# Patient Record
Sex: Female | Born: 1995 | Race: White | Hispanic: No | Marital: Single | Smoking: Never smoker
Health system: Southern US, Community
[De-identification: ages and names within clinical notes are randomized; demographics above are authoritative.]

## PROBLEM LIST (undated history)

## (undated) DIAGNOSIS — G43909 Migraine, unspecified, not intractable, without status migrainosus: Secondary | ICD-10-CM

## (undated) HISTORY — PX: NO PAST SURGERIES: SHX2092

---
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
## 2017-01-12 DIAGNOSIS — R102 Pelvic and perineal pain: Secondary | ICD-10-CM | POA: Diagnosis not present

## 2017-01-12 DIAGNOSIS — M545 Low back pain: Secondary | ICD-10-CM | POA: Diagnosis not present

## 2017-01-12 DIAGNOSIS — F419 Anxiety disorder, unspecified: Secondary | ICD-10-CM | POA: Diagnosis not present

## 2017-01-15 DIAGNOSIS — Z01419 Encounter for gynecological examination (general) (routine) without abnormal findings: Secondary | ICD-10-CM | POA: Diagnosis not present

## 2017-01-15 DIAGNOSIS — Z6824 Body mass index (BMI) 24.0-24.9, adult: Secondary | ICD-10-CM | POA: Diagnosis not present

## 2017-01-15 DIAGNOSIS — Z3042 Encounter for surveillance of injectable contraceptive: Secondary | ICD-10-CM | POA: Diagnosis not present

## 2017-04-08 DIAGNOSIS — Z3042 Encounter for surveillance of injectable contraceptive: Secondary | ICD-10-CM | POA: Diagnosis not present

## 2017-05-13 DIAGNOSIS — F419 Anxiety disorder, unspecified: Secondary | ICD-10-CM | POA: Diagnosis not present

## 2017-06-24 DIAGNOSIS — Z3042 Encounter for surveillance of injectable contraceptive: Secondary | ICD-10-CM | POA: Diagnosis not present

## 2017-09-10 DIAGNOSIS — R399 Unspecified symptoms and signs involving the genitourinary system: Secondary | ICD-10-CM | POA: Diagnosis not present

## 2017-09-10 DIAGNOSIS — L709 Acne, unspecified: Secondary | ICD-10-CM | POA: Diagnosis not present

## 2017-09-10 DIAGNOSIS — Z3009 Encounter for other general counseling and advice on contraception: Secondary | ICD-10-CM | POA: Diagnosis not present

## 2017-10-15 DIAGNOSIS — J069 Acute upper respiratory infection, unspecified: Secondary | ICD-10-CM | POA: Diagnosis not present

## 2017-11-03 DIAGNOSIS — L700 Acne vulgaris: Secondary | ICD-10-CM | POA: Diagnosis not present

## 2018-01-25 DIAGNOSIS — L700 Acne vulgaris: Secondary | ICD-10-CM | POA: Diagnosis not present

## 2018-03-29 ENCOUNTER — Other Ambulatory Visit: Payer: Self-pay

## 2018-03-29 ENCOUNTER — Emergency Department (HOSPITAL_BASED_OUTPATIENT_CLINIC_OR_DEPARTMENT_OTHER)
Admission: EM | Admit: 2018-03-29 | Discharge: 2018-03-30 | Disposition: A | Discharge status: Home / Self Care | Payer: BLUE CROSS/BLUE SHIELD | Attending: Emergency Medicine | Admitting: Emergency Medicine

## 2018-03-29 ENCOUNTER — Encounter (HOSPITAL_BASED_OUTPATIENT_CLINIC_OR_DEPARTMENT_OTHER): Payer: Self-pay | Admitting: *Deleted

## 2018-03-29 DIAGNOSIS — G43809 Other migraine, not intractable, without status migrainosus: Secondary | ICD-10-CM | POA: Diagnosis not present

## 2018-03-29 DIAGNOSIS — R112 Nausea with vomiting, unspecified: Secondary | ICD-10-CM | POA: Diagnosis not present

## 2018-03-29 DIAGNOSIS — R509 Fever, unspecified: Secondary | ICD-10-CM | POA: Insufficient documentation

## 2018-03-29 DIAGNOSIS — R1084 Generalized abdominal pain: Secondary | ICD-10-CM | POA: Diagnosis not present

## 2018-03-29 DIAGNOSIS — R109 Unspecified abdominal pain: Secondary | ICD-10-CM | POA: Diagnosis not present

## 2018-03-29 DIAGNOSIS — R5383 Other fatigue: Secondary | ICD-10-CM | POA: Diagnosis not present

## 2018-03-29 DIAGNOSIS — Z79899 Other long term (current) drug therapy: Secondary | ICD-10-CM | POA: Diagnosis not present

## 2018-03-29 LAB — CBC WITH DIFFERENTIAL/PLATELET
Abs Immature Granulocytes: 0.03 10*3/uL (ref 0.00–0.07)
BASOS ABS: 0.1 10*3/uL (ref 0.0–0.1)
Basophils Relative: 1 %
EOS PCT: 1 %
Eosinophils Absolute: 0.1 10*3/uL (ref 0.0–0.5)
HCT: 41 % (ref 36.0–46.0)
Hemoglobin: 13 g/dL (ref 12.0–15.0)
Immature Granulocytes: 0 %
LYMPHS PCT: 23 %
Lymphs Abs: 1.9 10*3/uL (ref 0.7–4.0)
MCH: 29.8 pg (ref 26.0–34.0)
MCHC: 31.7 g/dL (ref 30.0–36.0)
MCV: 94 fL (ref 80.0–100.0)
Monocytes Absolute: 0.5 10*3/uL (ref 0.1–1.0)
Monocytes Relative: 6 %
NRBC: 0 % (ref 0.0–0.2)
Neutro Abs: 5.8 10*3/uL (ref 1.7–7.7)
Neutrophils Relative %: 69 %
Platelets: 232 10*3/uL (ref 150–400)
RBC: 4.36 MIL/uL (ref 3.87–5.11)
RDW: 12.6 % (ref 11.5–15.5)
WBC: 8.4 10*3/uL (ref 4.0–10.5)

## 2018-03-29 LAB — COMPREHENSIVE METABOLIC PANEL
ALBUMIN: 4.4 g/dL (ref 3.5–5.0)
ALK PHOS: 66 U/L (ref 38–126)
ALT: 17 U/L (ref 0–44)
ANION GAP: 9 (ref 5–15)
AST: 16 U/L (ref 15–41)
BILIRUBIN TOTAL: 0.7 mg/dL (ref 0.3–1.2)
BUN: 11 mg/dL (ref 6–20)
CALCIUM: 9.8 mg/dL (ref 8.9–10.3)
CO2: 24 mmol/L (ref 22–32)
Chloride: 101 mmol/L (ref 98–111)
Creatinine, Ser: 0.64 mg/dL (ref 0.44–1.00)
GFR calc Af Amer: >60 mL/min (ref >60)
GFR calc non Af Amer: >60 mL/min (ref >60)
GLUCOSE: 90 mg/dL (ref 70–99)
POTASSIUM: 3.6 mmol/L (ref 3.5–5.1)
Sodium: 134 mmol/L — ABNORMAL LOW (ref 135–145)
Total Protein: 8 g/dL (ref 6.5–8.1)

## 2018-03-29 LAB — URINALYSIS, ROUTINE W REFLEX MICROSCOPIC
BILIRUBIN URINE: NEGATIVE
Glucose, UA: NEGATIVE mg/dL
Hgb urine dipstick: NEGATIVE
KETONES UR: 15 mg/dL — AB
Leukocytes,Ua: NEGATIVE
NITRITE: NEGATIVE
PH: 6.5 (ref 5.0–8.0)
Protein, ur: NEGATIVE mg/dL
Specific Gravity, Urine: 1.015 (ref 1.005–1.030)

## 2018-03-29 LAB — PREGNANCY, URINE: Preg Test, Ur: NEGATIVE

## 2018-03-29 MED ORDER — PROCHLORPERAZINE MALEATE 10 MG PO TABS
10.0000 mg | ORAL_TABLET | Freq: Once | ORAL | Status: DC
  Filled 2018-03-29: qty 1

## 2018-03-29 MED ORDER — ONDANSETRON HCL 4 MG PO TABS: 4.0000 mg | ORAL_TABLET | Freq: Four times a day (QID) | ORAL | 0 refills | Status: DC

## 2018-03-29 MED ORDER — KETOROLAC TROMETHAMINE 15 MG/ML IJ SOLN
15.0000 mg | Freq: Once | INTRAMUSCULAR | Status: AC
  Administered 2018-03-29: 15 mg via INTRAVENOUS
  Filled 2018-03-29: qty 1

## 2018-03-29 MED ORDER — DIPHENHYDRAMINE HCL 50 MG/ML IJ SOLN
25.0000 mg | Freq: Once | INTRAMUSCULAR | Status: AC
  Administered 2018-03-29: 25 mg via INTRAVENOUS
  Filled 2018-03-29: qty 1

## 2018-03-29 MED ORDER — NAPROXEN 500 MG PO TABS: 500.0000 mg | ORAL_TABLET | Freq: Two times a day (BID) | ORAL | 0 refills | Status: AC

## 2018-03-29 MED ORDER — SODIUM CHLORIDE 0.9 % IV BOLUS (SEPSIS)
1000.0000 mL | Freq: Once | INTRAVENOUS | Status: AC
  Administered 2018-03-29: 1000 mL via INTRAVENOUS

## 2018-03-29 MED ORDER — ONDANSETRON HCL 4 MG/2ML IJ SOLN
4.0000 mg | Freq: Once | INTRAMUSCULAR | Status: AC
  Administered 2018-03-29: 4 mg via INTRAVENOUS
  Filled 2018-03-29: qty 2

## 2018-03-29 MED ORDER — PROCHLORPERAZINE EDISYLATE 10 MG/2ML IJ SOLN
10.0000 mg | INTRAMUSCULAR | Status: AC
  Administered 2018-03-29: 10 mg via INTRAVENOUS
  Filled 2018-03-29: qty 2

## 2018-03-29 NOTE — ED Provider Notes (Signed)
[facility] EMERGENCY DEPARTMENT Provider Note   CSN: [id number] Arrival date & time: 03/29/18  1922    History   Chief Complaint Chief Complaint  Patient presents with  . Migraine    HPI Autumn Vaughn is a 23 y.o. female.     Patient is a 23 year old female with past medical history of migraine headaches who presents the emergency department for 1 day of migraine headache, nausea, vomiting and subjective fever.  She reports that her migraine headache is typical of her usual headaches and is frontal and radiating to the back of her head.  Reports that it feels slightly more intense than usual.  Reports that she has previously had vomiting associated with her migraines but has not had this much vomiting before.  Reports that she is unable to keep anything down.  She reports that she has some cramping abdominal pain which comes with eating and before vomiting. Has not tried anything for relief.  Denies any diarrhea, dysuria, vaginal bleeding, vaginal discharge.  Reports that she has had a normal periods.  Denies any coughing, sore throat, congestion.  Reports that she felt like she had a fever at home but did not take her temperature.  She reports that she has associated photophobia and phonophobia.  There are no exacerbating or relieving factors     Past Medical History:  Diagnosis Date  . Migraine     There are no active problems to display for this patient.   History reviewed. No pertinent surgical history.   OB History   No obstetric history on file.      Home Medications    Prior to Admission medications   Medication Sig Start Date End Date Taking? Authorizing Provider  doxycycline (ORACEA) 40 MG capsule Take 40 mg by mouth every morning.    [provider]  Famotidine (PEPCID PO) Take by mouth.    [provider]  FLUoxetine HCl (PROZAC PO) Take by mouth.    [provider]  naproxen (NAPROSYN) 500 MG tablet Take 1 tablet (500  mg total) by mouth 2 (two) times daily for 7 days. 03/29/18 04/05/18  Ronnie DossMcLean, Meline Russaw A, PA-C  ondansetron (ZOFRAN) 4 MG tablet Take 1 tablet (4 mg total) by mouth every 6 (six) hours. 03/29/18   Jeral PinchMcLean, Cyera Balboni A, PA-C    Family History History reviewed. No pertinent family history.  Social History Social History   Tobacco Use  . Smoking status: Never Smoker  . Smokeless tobacco: Never Used  Substance Use Topics  . Alcohol use: No  . Drug use: No     Allergies   Patient has no known allergies.   Review of Systems Review of Systems  Constitutional: Positive for appetite change and fatigue. Negative for activity change, chills and fever.  HENT: Negative for congestion, ear pain, rhinorrhea, sinus pressure, sinus pain, sore throat and trouble swallowing.   Eyes: Positive for photophobia. Negative for pain, redness and visual disturbance.  Respiratory: Negative for cough and shortness of breath.   Cardiovascular: Negative for chest pain and palpitations.  Gastrointestinal: Positive for abdominal pain, nausea and vomiting. Negative for constipation, diarrhea and rectal pain.  Genitourinary: Negative for dysuria, flank pain and hematuria.  Musculoskeletal: Negative for arthralgias and back pain.  Skin: Negative for color change and rash.  Allergic/Immunologic: Negative for immunocompromised state.  Neurological: Positive for headaches. Negative for dizziness, tremors, seizures, syncope, speech difficulty, light-headedness and numbness.  All other systems reviewed and are negative.  Physical Exam Updated Vital Signs BP 110/64 (BP Location: Right Arm)   Pulse 62   Temp 98.7 F (37.1 C) (Oral)   Resp 16   Ht 5\' 5"  (1.651 m)   Wt 63.5 kg   LMP 03/24/2018   SpO2 100%   BMI 23.30 kg/m   Physical Exam Vitals signs and nursing note reviewed.  Constitutional:      General: She is not in acute distress.    Appearance: Normal appearance.  HENT:     Head: Normocephalic and  atraumatic.     Nose: Nose normal.     Mouth/Throat:     Mouth: Mucous membranes are moist.     Pharynx: Oropharynx is clear.  Eyes:     Conjunctiva/sclera: Conjunctivae normal.     Pupils: Pupils are equal, round, and reactive to light.  Cardiovascular:     Rate and Rhythm: Normal rate and regular rhythm.  Pulmonary:     Effort: Pulmonary effort is normal.     Breath sounds: Normal breath sounds. No wheezing, rhonchi or rales.  Abdominal:     General: Abdomen is flat. Bowel sounds are normal.     Tenderness: There is abdominal tenderness (diffuse). There is no right CVA tenderness, left CVA tenderness or guarding.  Musculoskeletal:     Right lower leg: No edema.     Left lower leg: No edema.  Skin:    General: Skin is warm.     Capillary Refill: Capillary refill takes less than 2 seconds.  Neurological:     General: No focal deficit present.     Mental Status: She is alert and oriented to person, place, and time. Mental status is at baseline.     Sensory: No sensory deficit.     Motor: No weakness.     Coordination: Coordination normal.  Psychiatric:        Mood and Affect: Mood normal.      ED Treatments / Results  Labs (all labs ordered are listed, but only abnormal results are displayed) Labs Reviewed  COMPREHENSIVE METABOLIC PANEL - Abnormal; Notable for the following components:      Result Value   Sodium 134 (*)    All other components within normal limits  URINALYSIS, ROUTINE W REFLEX MICROSCOPIC - Abnormal; Notable for the following components:   Ketones, ur 15 (*)    All other components within normal limits  CBC WITH DIFFERENTIAL/PLATELET  PREGNANCY, URINE  INFLUENZA PANEL BY PCR (TYPE A & B)    EKG None  Radiology No results found.  Procedures Procedures (including critical care time)  Medications Ordered in ED Medications  ketorolac (TORADOL) 15 MG/ML injection 15 mg (15 mg Intravenous Given 03/29/18 2240)  sodium chloride 0.9 % bolus 1,000 mL  (0 mLs Intravenous Stopped 03/29/18 2351)  ondansetron (ZOFRAN) injection 4 mg (4 mg Intravenous Given 03/29/18 2240)  diphenhydrAMINE (BENADRYL) injection 25 mg (25 mg Intravenous Given 03/29/18 2241)  prochlorperazine (COMPAZINE) injection 10 mg (10 mg Intravenous Given 03/29/18 2310)     Initial Impression / Assessment and Plan / ED Course  I have reviewed the triage vital signs and the nursing notes.  Pertinent labs & imaging results that were available during my care of the patient were reviewed by me and considered in my medical decision making (see chart for details).  Clinical Course as of Mar 29 2349  Tue Mar 29, 2018  2341 Patient is feeling better and is sleeping on the stretcher.  Her lab work  is unremarkable.  Her symptoms are consistent with her usual migraine as well as an additional vomiting and fever.  I feel like she probably has a viral infection which is flaring her migraine.  I will send her prescriptions for Zofran and naproxen to the pharmacy.  I will refer her to a new neurologist as she states her migraines have become more frequent this month.  She was advised to stay hydrated and return to the emergency department if she has any new or worsening symptoms.  Patient plan was also was discussed with the patient's mother at bedside   [KM]    Clinical Course User Index [KM] Arlyn Dunning, PA-C       Based on review of vitals, medical screening exam, lab work and/or imaging, there does not appear to be an acute, emergent etiology for the patient's symptoms. Counseled pt on good return precautions and encouraged both PCP and ED follow-up as needed.  Prior to discharge, I also discussed incidental imaging findings with patient in detail and advised appropriate, recommended follow-up in detail.  Clinical Impression: 1. Other migraine without status migrainosus, not intractable   2. Non-intractable vomiting with nausea, unspecified vomiting type     Disposition:  Discharge    This note was prepared with assistance of Dragon voice recognition software. Occasional wrong-word or sound-a-like substitutions may have occurred due to the inherent limitations of voice recognition software.   Final Clinical Impressions(s) / ED Diagnoses   Final diagnoses:  Other migraine without status migrainosus, not intractable  Non-intractable vomiting with nausea, unspecified vomiting type    ED Discharge Orders         Ordered    ondansetron (ZOFRAN) 4 MG tablet  Every 6 hours     03/29/18 2345    naproxen (NAPROSYN) 500 MG tablet  2 times daily     03/29/18 2345           Jeral Pinch 03/29/18 2352    Charlynne Pander, MD 04/01/18 1416

## 2018-03-29 NOTE — Discharge Instructions (Signed)
Thank you for allowing me to care for you today. Please return to the emergency department if you have ANY new or worsening symptoms.  Drink plenty of water to stay hydrated.

## 2018-03-29 NOTE — ED Notes (Signed)
PT states understanding of care given, follow up care, and medication prescribed. PT ambulated from ED to car with a steady gait. 

## 2018-03-29 NOTE — ED Triage Notes (Signed)
Pt c/o " migraine" x 1 day  

## 2018-03-30 LAB — INFLUENZA PANEL BY PCR (TYPE A & B)
Influenza A By PCR: NEGATIVE
Influenza B By PCR: NEGATIVE

## 2018-04-18 DIAGNOSIS — G43119 Migraine with aura, intractable, without status migrainosus: Secondary | ICD-10-CM | POA: Diagnosis not present

## 2018-04-18 DIAGNOSIS — G44209 Tension-type headache, unspecified, not intractable: Secondary | ICD-10-CM | POA: Diagnosis not present

## 2018-04-18 DIAGNOSIS — F331 Major depressive disorder, recurrent, moderate: Secondary | ICD-10-CM | POA: Diagnosis not present

## 2018-05-19 DIAGNOSIS — G43519 Persistent migraine aura without cerebral infarction, intractable, without status migrainosus: Secondary | ICD-10-CM | POA: Diagnosis not present

## 2018-05-19 DIAGNOSIS — F4541 Pain disorder exclusively related to psychological factors: Secondary | ICD-10-CM | POA: Diagnosis not present

## 2018-06-08 DIAGNOSIS — G43519 Persistent migraine aura without cerebral infarction, intractable, without status migrainosus: Secondary | ICD-10-CM | POA: Diagnosis not present

## 2018-06-08 DIAGNOSIS — Z23 Encounter for immunization: Secondary | ICD-10-CM | POA: Diagnosis not present

## 2018-06-08 DIAGNOSIS — F331 Major depressive disorder, recurrent, moderate: Secondary | ICD-10-CM | POA: Diagnosis not present

## 2018-07-11 DIAGNOSIS — Z23 Encounter for immunization: Secondary | ICD-10-CM | POA: Diagnosis not present

## 2018-07-11 DIAGNOSIS — F419 Anxiety disorder, unspecified: Secondary | ICD-10-CM | POA: Diagnosis not present

## 2018-07-11 DIAGNOSIS — G43519 Persistent migraine aura without cerebral infarction, intractable, without status migrainosus: Secondary | ICD-10-CM | POA: Diagnosis not present

## 2018-07-11 DIAGNOSIS — F331 Major depressive disorder, recurrent, moderate: Secondary | ICD-10-CM | POA: Diagnosis not present

## 2018-08-04 DIAGNOSIS — G43009 Migraine without aura, not intractable, without status migrainosus: Secondary | ICD-10-CM | POA: Diagnosis not present

## 2018-10-06 DIAGNOSIS — Z20828 Contact with and (suspected) exposure to other viral communicable diseases: Secondary | ICD-10-CM | POA: Diagnosis not present

## 2018-10-18 DIAGNOSIS — G43009 Migraine without aura, not intractable, without status migrainosus: Secondary | ICD-10-CM | POA: Diagnosis not present

## 2018-12-02 DIAGNOSIS — Z01419 Encounter for gynecological examination (general) (routine) without abnormal findings: Secondary | ICD-10-CM | POA: Diagnosis not present

## 2018-12-02 DIAGNOSIS — Z6826 Body mass index (BMI) 26.0-26.9, adult: Secondary | ICD-10-CM | POA: Diagnosis not present

## 2018-12-08 DIAGNOSIS — G43009 Migraine without aura, not intractable, without status migrainosus: Secondary | ICD-10-CM | POA: Diagnosis not present

## 2019-01-12 DIAGNOSIS — F331 Major depressive disorder, recurrent, moderate: Secondary | ICD-10-CM | POA: Diagnosis not present

## 2019-01-12 DIAGNOSIS — F419 Anxiety disorder, unspecified: Secondary | ICD-10-CM | POA: Diagnosis not present

## 2019-01-12 DIAGNOSIS — G43519 Persistent migraine aura without cerebral infarction, intractable, without status migrainosus: Secondary | ICD-10-CM | POA: Diagnosis not present

## 2019-01-12 DIAGNOSIS — Z23 Encounter for immunization: Secondary | ICD-10-CM | POA: Diagnosis not present

## 2019-01-12 DIAGNOSIS — R109 Unspecified abdominal pain: Secondary | ICD-10-CM | POA: Diagnosis not present

## 2019-01-19 DIAGNOSIS — G43009 Migraine without aura, not intractable, without status migrainosus: Secondary | ICD-10-CM | POA: Diagnosis not present

## 2019-02-23 DIAGNOSIS — F331 Major depressive disorder, recurrent, moderate: Secondary | ICD-10-CM | POA: Diagnosis not present

## 2019-02-23 DIAGNOSIS — F419 Anxiety disorder, unspecified: Secondary | ICD-10-CM | POA: Diagnosis not present

## 2019-02-28 DIAGNOSIS — Z23 Encounter for immunization: Secondary | ICD-10-CM | POA: Diagnosis not present

## 2019-02-28 DIAGNOSIS — L700 Acne vulgaris: Secondary | ICD-10-CM | POA: Diagnosis not present

## 2019-02-28 DIAGNOSIS — Z79899 Other long term (current) drug therapy: Secondary | ICD-10-CM | POA: Diagnosis not present

## 2019-03-20 DIAGNOSIS — L700 Acne vulgaris: Secondary | ICD-10-CM | POA: Diagnosis not present

## 2019-03-20 DIAGNOSIS — F331 Major depressive disorder, recurrent, moderate: Secondary | ICD-10-CM | POA: Diagnosis not present

## 2019-04-03 DIAGNOSIS — L700 Acne vulgaris: Secondary | ICD-10-CM | POA: Diagnosis not present

## 2019-04-03 DIAGNOSIS — Z79899 Other long term (current) drug therapy: Secondary | ICD-10-CM | POA: Diagnosis not present

## 2019-04-24 DIAGNOSIS — L700 Acne vulgaris: Secondary | ICD-10-CM | POA: Diagnosis not present

## 2019-05-11 DIAGNOSIS — G43009 Migraine without aura, not intractable, without status migrainosus: Secondary | ICD-10-CM | POA: Diagnosis not present

## 2019-05-29 DIAGNOSIS — Z79899 Other long term (current) drug therapy: Secondary | ICD-10-CM | POA: Diagnosis not present

## 2019-05-29 DIAGNOSIS — L700 Acne vulgaris: Secondary | ICD-10-CM | POA: Diagnosis not present

## 2019-06-01 ENCOUNTER — Ambulatory Visit: Payer: BC Managed Care – PPO | Attending: Internal Medicine

## 2019-06-01 DIAGNOSIS — Z23 Encounter for immunization: Secondary | ICD-10-CM

## 2019-06-01 NOTE — Progress Notes (Signed)
   Covid-19 Vaccination Clinic  Name:  Milca Sytsma    MRN: [id number] DOB: 1995-05-19  06/01/2019  Ms. Bryand was observed post Covid-19 immunization for 15 minutes without incident. She was provided with Vaccine Information Sheet and instruction to access the V-Safe system.   Ms. Strong was instructed to call 911 with any severe reactions post vaccine: Marland Kitchen Difficulty breathing  . Swelling of face and throat  . A fast heartbeat  . A bad rash all over body  . Dizziness and weakness   Immunizations Administered    Name Date Dose VIS Date Route   Pfizer COVID-19 Vaccine 06/01/2019  3:28 PM 0.3 mL 04/05/2018 Intramuscular   Manufacturer: [employer], [employer]   Lot: [id number]   NDC: [address]-1

## 2019-06-08 DIAGNOSIS — F331 Major depressive disorder, recurrent, moderate: Secondary | ICD-10-CM | POA: Diagnosis not present

## 2019-06-26 ENCOUNTER — Ambulatory Visit: Payer: BC Managed Care – PPO | Attending: Internal Medicine

## 2019-06-26 DIAGNOSIS — Z23 Encounter for immunization: Secondary | ICD-10-CM

## 2019-06-26 NOTE — Progress Notes (Signed)
   Covid-19 Vaccination Clinic  Name:  Autumn Vaughn    MRN: [id number] DOB: 1995/10/07  06/26/2019  [name] was observed post Covid-19 immunization for 15 minutes without incident. She was provided with Vaccine Information Sheet and instruction to access the V-Safe system.   [name] was instructed to call 911 with any severe reactions post vaccine: Marland Kitchen Difficulty breathing  . Swelling of face and throat  . A fast heartbeat  . A bad rash all over body  . Dizziness and weakness   Immunizations Administered    Name Date Dose VIS Date Route   Pfizer COVID-19 Vaccine 06/26/2019  3:08 PM 0.3 mL 04/05/2018 Intramuscular   Manufacturer: [employer], [employer]   Lot: [id number]   NDC: [address]-8

## 2019-07-03 DIAGNOSIS — L700 Acne vulgaris: Secondary | ICD-10-CM | POA: Diagnosis not present

## 2019-07-03 DIAGNOSIS — Z79899 Other long term (current) drug therapy: Secondary | ICD-10-CM | POA: Diagnosis not present

## 2019-08-02 DIAGNOSIS — Z79899 Other long term (current) drug therapy: Secondary | ICD-10-CM | POA: Diagnosis not present

## 2019-08-02 DIAGNOSIS — L700 Acne vulgaris: Secondary | ICD-10-CM | POA: Diagnosis not present

## 2019-08-09 DIAGNOSIS — F331 Major depressive disorder, recurrent, moderate: Secondary | ICD-10-CM | POA: Diagnosis not present

## 2019-08-09 DIAGNOSIS — F419 Anxiety disorder, unspecified: Secondary | ICD-10-CM | POA: Diagnosis not present

## 2019-09-04 DIAGNOSIS — L700 Acne vulgaris: Secondary | ICD-10-CM | POA: Diagnosis not present

## 2019-09-04 DIAGNOSIS — Z79899 Other long term (current) drug therapy: Secondary | ICD-10-CM | POA: Diagnosis not present

## 2019-09-14 DIAGNOSIS — F331 Major depressive disorder, recurrent, moderate: Secondary | ICD-10-CM | POA: Diagnosis not present

## 2019-09-14 DIAGNOSIS — F419 Anxiety disorder, unspecified: Secondary | ICD-10-CM | POA: Diagnosis not present

## 2019-09-14 DIAGNOSIS — J309 Allergic rhinitis, unspecified: Secondary | ICD-10-CM | POA: Diagnosis not present

## 2019-10-04 DIAGNOSIS — L700 Acne vulgaris: Secondary | ICD-10-CM | POA: Diagnosis not present

## 2019-10-04 DIAGNOSIS — Z79899 Other long term (current) drug therapy: Secondary | ICD-10-CM | POA: Diagnosis not present

## 2019-11-01 DIAGNOSIS — G43009 Migraine without aura, not intractable, without status migrainosus: Secondary | ICD-10-CM | POA: Diagnosis not present

## 2019-11-06 DIAGNOSIS — L700 Acne vulgaris: Secondary | ICD-10-CM | POA: Diagnosis not present

## 2019-11-06 DIAGNOSIS — Z79899 Other long term (current) drug therapy: Secondary | ICD-10-CM | POA: Diagnosis not present

## 2019-11-16 DIAGNOSIS — F331 Major depressive disorder, recurrent, moderate: Secondary | ICD-10-CM | POA: Diagnosis not present

## 2019-12-07 DIAGNOSIS — Z79899 Other long term (current) drug therapy: Secondary | ICD-10-CM | POA: Diagnosis not present

## 2019-12-07 DIAGNOSIS — L650 Telogen effluvium: Secondary | ICD-10-CM | POA: Diagnosis not present

## 2019-12-07 DIAGNOSIS — L700 Acne vulgaris: Secondary | ICD-10-CM | POA: Diagnosis not present

## 2019-12-08 DIAGNOSIS — L700 Acne vulgaris: Secondary | ICD-10-CM | POA: Diagnosis not present

## 2019-12-20 DIAGNOSIS — F419 Anxiety disorder, unspecified: Secondary | ICD-10-CM | POA: Diagnosis not present

## 2019-12-20 DIAGNOSIS — F331 Major depressive disorder, recurrent, moderate: Secondary | ICD-10-CM | POA: Diagnosis not present

## 2019-12-27 DIAGNOSIS — G43009 Migraine without aura, not intractable, without status migrainosus: Secondary | ICD-10-CM | POA: Diagnosis not present

## 2020-01-03 DIAGNOSIS — L700 Acne vulgaris: Secondary | ICD-10-CM | POA: Diagnosis not present

## 2020-01-26 ENCOUNTER — Ambulatory Visit: Payer: BC Managed Care – PPO | Attending: Internal Medicine

## 2020-01-26 DIAGNOSIS — Z23 Encounter for immunization: Secondary | ICD-10-CM

## 2020-01-26 NOTE — Progress Notes (Signed)
   Covid-19 Vaccination Clinic  Name:  Autumn Vaughn    MRN: [id number] DOB: September 18, 1995  01/26/2020  [name] was observed post Covid-19 immunization for 15 minutes without incident. She was provided with Vaccine Information Sheet and instruction to access the V-Safe system.   [name] was instructed to call 911 with any severe reactions post vaccine: Marland Kitchen Difficulty breathing  . Swelling of face and throat  . A fast heartbeat  . A bad rash all over body  . Dizziness and weakness   Immunizations Administered    Name Date Dose VIS Date Route   Pfizer COVID-19 Vaccine 01/26/2020  3:25 PM 0.3 mL 11/29/2019 Intramuscular   Manufacturer: [employer], [employer]   Lot: [id number]   NDC: [address]-1

## 2020-02-05 DIAGNOSIS — Z79899 Other long term (current) drug therapy: Secondary | ICD-10-CM | POA: Diagnosis not present

## 2020-02-05 DIAGNOSIS — L700 Acne vulgaris: Secondary | ICD-10-CM | POA: Diagnosis not present

## 2020-02-12 DIAGNOSIS — Z79899 Other long term (current) drug therapy: Secondary | ICD-10-CM | POA: Diagnosis not present

## 2020-02-12 DIAGNOSIS — Z13220 Encounter for screening for lipoid disorders: Secondary | ICD-10-CM | POA: Diagnosis not present

## 2020-02-12 DIAGNOSIS — Z23 Encounter for immunization: Secondary | ICD-10-CM | POA: Diagnosis not present

## 2020-02-12 DIAGNOSIS — F419 Anxiety disorder, unspecified: Secondary | ICD-10-CM | POA: Diagnosis not present

## 2020-02-12 DIAGNOSIS — F331 Major depressive disorder, recurrent, moderate: Secondary | ICD-10-CM | POA: Diagnosis not present

## 2020-02-12 DIAGNOSIS — R635 Abnormal weight gain: Secondary | ICD-10-CM | POA: Diagnosis not present

## 2020-02-13 DIAGNOSIS — G43009 Migraine without aura, not intractable, without status migrainosus: Secondary | ICD-10-CM | POA: Diagnosis not present

## 2020-02-15 DIAGNOSIS — Z03818 Encounter for observation for suspected exposure to other biological agents ruled out: Secondary | ICD-10-CM | POA: Diagnosis not present

## 2020-03-07 DIAGNOSIS — L309 Dermatitis, unspecified: Secondary | ICD-10-CM | POA: Diagnosis not present

## 2020-03-07 DIAGNOSIS — L700 Acne vulgaris: Secondary | ICD-10-CM | POA: Diagnosis not present

## 2020-03-07 DIAGNOSIS — L730 Acne keloid: Secondary | ICD-10-CM | POA: Diagnosis not present

## 2020-03-07 DIAGNOSIS — Z79899 Other long term (current) drug therapy: Secondary | ICD-10-CM | POA: Diagnosis not present

## 2020-03-27 DIAGNOSIS — G43519 Persistent migraine aura without cerebral infarction, intractable, without status migrainosus: Secondary | ICD-10-CM | POA: Diagnosis not present

## 2020-03-27 DIAGNOSIS — F419 Anxiety disorder, unspecified: Secondary | ICD-10-CM | POA: Diagnosis not present

## 2020-03-27 DIAGNOSIS — F331 Major depressive disorder, recurrent, moderate: Secondary | ICD-10-CM | POA: Diagnosis not present

## 2020-04-22 DIAGNOSIS — L700 Acne vulgaris: Secondary | ICD-10-CM | POA: Diagnosis not present

## 2020-04-23 DIAGNOSIS — L730 Acne keloid: Secondary | ICD-10-CM | POA: Diagnosis not present

## 2020-04-23 DIAGNOSIS — L650 Telogen effluvium: Secondary | ICD-10-CM | POA: Diagnosis not present

## 2020-05-13 DIAGNOSIS — F331 Major depressive disorder, recurrent, moderate: Secondary | ICD-10-CM | POA: Diagnosis not present

## 2020-05-13 DIAGNOSIS — F419 Anxiety disorder, unspecified: Secondary | ICD-10-CM | POA: Diagnosis not present

## 2020-05-13 DIAGNOSIS — G43519 Persistent migraine aura without cerebral infarction, intractable, without status migrainosus: Secondary | ICD-10-CM | POA: Diagnosis not present

## 2020-07-04 DIAGNOSIS — F331 Major depressive disorder, recurrent, moderate: Secondary | ICD-10-CM | POA: Diagnosis not present

## 2020-09-26 DIAGNOSIS — L700 Acne vulgaris: Secondary | ICD-10-CM | POA: Diagnosis not present

## 2020-09-26 DIAGNOSIS — Z79899 Other long term (current) drug therapy: Secondary | ICD-10-CM | POA: Diagnosis not present

## 2020-10-07 DIAGNOSIS — L700 Acne vulgaris: Secondary | ICD-10-CM | POA: Diagnosis not present

## 2020-10-07 DIAGNOSIS — F410 Panic disorder [episodic paroxysmal anxiety] without agoraphobia: Secondary | ICD-10-CM | POA: Diagnosis not present

## 2020-10-07 DIAGNOSIS — F331 Major depressive disorder, recurrent, moderate: Secondary | ICD-10-CM | POA: Diagnosis not present

## 2020-10-08 DIAGNOSIS — F411 Generalized anxiety disorder: Secondary | ICD-10-CM | POA: Diagnosis not present

## 2020-10-08 DIAGNOSIS — F331 Major depressive disorder, recurrent, moderate: Secondary | ICD-10-CM | POA: Diagnosis not present

## 2020-10-15 DIAGNOSIS — F411 Generalized anxiety disorder: Secondary | ICD-10-CM | POA: Diagnosis not present

## 2020-10-15 DIAGNOSIS — F331 Major depressive disorder, recurrent, moderate: Secondary | ICD-10-CM | POA: Diagnosis not present

## 2020-10-23 DIAGNOSIS — F411 Generalized anxiety disorder: Secondary | ICD-10-CM | POA: Diagnosis not present

## 2020-10-23 DIAGNOSIS — F331 Major depressive disorder, recurrent, moderate: Secondary | ICD-10-CM | POA: Diagnosis not present

## 2020-10-24 DIAGNOSIS — F411 Generalized anxiety disorder: Secondary | ICD-10-CM | POA: Diagnosis not present

## 2020-10-24 DIAGNOSIS — F331 Major depressive disorder, recurrent, moderate: Secondary | ICD-10-CM | POA: Diagnosis not present

## 2020-10-28 DIAGNOSIS — Z79899 Other long term (current) drug therapy: Secondary | ICD-10-CM | POA: Diagnosis not present

## 2020-10-28 DIAGNOSIS — L700 Acne vulgaris: Secondary | ICD-10-CM | POA: Diagnosis not present

## 2020-10-31 DIAGNOSIS — F331 Major depressive disorder, recurrent, moderate: Secondary | ICD-10-CM | POA: Diagnosis not present

## 2020-10-31 DIAGNOSIS — F411 Generalized anxiety disorder: Secondary | ICD-10-CM | POA: Diagnosis not present

## 2020-11-06 DIAGNOSIS — F331 Major depressive disorder, recurrent, moderate: Secondary | ICD-10-CM | POA: Diagnosis not present

## 2020-11-06 DIAGNOSIS — F411 Generalized anxiety disorder: Secondary | ICD-10-CM | POA: Diagnosis not present

## 2020-11-06 DIAGNOSIS — G4720 Circadian rhythm sleep disorder, unspecified type: Secondary | ICD-10-CM | POA: Diagnosis not present

## 2020-11-07 DIAGNOSIS — F331 Major depressive disorder, recurrent, moderate: Secondary | ICD-10-CM | POA: Diagnosis not present

## 2020-11-07 DIAGNOSIS — F411 Generalized anxiety disorder: Secondary | ICD-10-CM | POA: Diagnosis not present

## 2020-11-14 DIAGNOSIS — F411 Generalized anxiety disorder: Secondary | ICD-10-CM | POA: Diagnosis not present

## 2020-11-14 DIAGNOSIS — F331 Major depressive disorder, recurrent, moderate: Secondary | ICD-10-CM | POA: Diagnosis not present

## 2020-11-20 DIAGNOSIS — F411 Generalized anxiety disorder: Secondary | ICD-10-CM | POA: Diagnosis not present

## 2020-11-20 DIAGNOSIS — F331 Major depressive disorder, recurrent, moderate: Secondary | ICD-10-CM | POA: Diagnosis not present

## 2020-11-20 DIAGNOSIS — G4720 Circadian rhythm sleep disorder, unspecified type: Secondary | ICD-10-CM | POA: Diagnosis not present

## 2020-11-21 DIAGNOSIS — F411 Generalized anxiety disorder: Secondary | ICD-10-CM | POA: Diagnosis not present

## 2020-11-21 DIAGNOSIS — F331 Major depressive disorder, recurrent, moderate: Secondary | ICD-10-CM | POA: Diagnosis not present

## 2020-11-27 DIAGNOSIS — Z79899 Other long term (current) drug therapy: Secondary | ICD-10-CM | POA: Diagnosis not present

## 2020-11-27 DIAGNOSIS — L700 Acne vulgaris: Secondary | ICD-10-CM | POA: Diagnosis not present

## 2020-11-28 DIAGNOSIS — F331 Major depressive disorder, recurrent, moderate: Secondary | ICD-10-CM | POA: Diagnosis not present

## 2020-11-28 DIAGNOSIS — F411 Generalized anxiety disorder: Secondary | ICD-10-CM | POA: Diagnosis not present

## 2020-12-02 ENCOUNTER — Encounter (HOSPITAL_COMMUNITY): Payer: Self-pay | Admitting: Emergency Medicine

## 2020-12-02 ENCOUNTER — Emergency Department (HOSPITAL_COMMUNITY): Payer: BC Managed Care – PPO

## 2020-12-02 ENCOUNTER — Emergency Department (HOSPITAL_COMMUNITY)
Admission: EM | Admit: 2020-12-02 | Discharge: 2020-12-02 | Disposition: A | Discharge status: Home / Self Care | Payer: BC Managed Care – PPO | Attending: Emergency Medicine | Admitting: Emergency Medicine

## 2020-12-02 ENCOUNTER — Other Ambulatory Visit: Payer: Self-pay

## 2020-12-02 DIAGNOSIS — M549 Dorsalgia, unspecified: Secondary | ICD-10-CM | POA: Insufficient documentation

## 2020-12-02 DIAGNOSIS — Q438 Other specified congenital malformations of intestine: Secondary | ICD-10-CM | POA: Diagnosis not present

## 2020-12-02 DIAGNOSIS — I878 Other specified disorders of veins: Secondary | ICD-10-CM | POA: Diagnosis not present

## 2020-12-02 DIAGNOSIS — R109 Unspecified abdominal pain: Secondary | ICD-10-CM | POA: Diagnosis not present

## 2020-12-02 DIAGNOSIS — R1010 Upper abdominal pain, unspecified: Secondary | ICD-10-CM | POA: Diagnosis not present

## 2020-12-02 DIAGNOSIS — R112 Nausea with vomiting, unspecified: Secondary | ICD-10-CM | POA: Insufficient documentation

## 2020-12-02 DIAGNOSIS — M545 Low back pain, unspecified: Secondary | ICD-10-CM | POA: Diagnosis not present

## 2020-12-02 LAB — CBC WITH DIFFERENTIAL/PLATELET
Abs Immature Granulocytes: 0.03 10*3/uL (ref 0.00–0.07)
Basophils Absolute: 0.1 10*3/uL (ref 0.0–0.1)
Basophils Relative: 1 %
Eosinophils Absolute: 0.1 10*3/uL (ref 0.0–0.5)
Eosinophils Relative: 0 %
HCT: 39.4 % (ref 36.0–46.0)
Hemoglobin: 13 g/dL (ref 12.0–15.0)
Immature Granulocytes: 0 %
Lymphocytes Relative: 13 %
Lymphs Abs: 1.6 10*3/uL (ref 0.7–4.0)
MCH: 29.7 pg (ref 26.0–34.0)
MCHC: 33 g/dL (ref 30.0–36.0)
MCV: 90.2 fL (ref 80.0–100.0)
Monocytes Absolute: 0.8 10*3/uL (ref 0.1–1.0)
Monocytes Relative: 7 %
Neutro Abs: 9.5 10*3/uL — ABNORMAL HIGH (ref 1.7–7.7)
Neutrophils Relative %: 79 %
Platelets: 275 10*3/uL (ref 150–400)
RBC: 4.37 MIL/uL (ref 3.87–5.11)
RDW: 12.6 % (ref 11.5–15.5)
WBC: 12 10*3/uL — ABNORMAL HIGH (ref 4.0–10.5)
nRBC: 0 % (ref 0.0–0.2)

## 2020-12-02 LAB — URINALYSIS, ROUTINE W REFLEX MICROSCOPIC
Bilirubin Urine: NEGATIVE
Glucose, UA: NEGATIVE mg/dL
Hgb urine dipstick: NEGATIVE
Ketones, ur: 5 mg/dL — AB
Leukocytes,Ua: NEGATIVE
Nitrite: NEGATIVE
Protein, ur: NEGATIVE mg/dL
Specific Gravity, Urine: 1.016 (ref 1.005–1.030)
pH: 8 (ref 5.0–8.0)

## 2020-12-02 LAB — COMPREHENSIVE METABOLIC PANEL
ALT: 15 U/L (ref 0–44)
AST: 19 U/L (ref 15–41)
Albumin: 4.2 g/dL (ref 3.5–5.0)
Alkaline Phosphatase: 67 U/L (ref 38–126)
Anion gap: 12 (ref 5–15)
BUN: 13 mg/dL (ref 6–20)
CO2: 19 mmol/L — ABNORMAL LOW (ref 22–32)
Calcium: 9.7 mg/dL (ref 8.9–10.3)
Chloride: 107 mmol/L (ref 98–111)
Creatinine, Ser: 0.78 mg/dL (ref 0.44–1.00)
GFR, Estimated: >60 mL/min (ref >60)
Glucose, Bld: 125 mg/dL — ABNORMAL HIGH (ref 70–99)
Potassium: 3.4 mmol/L — ABNORMAL LOW (ref 3.5–5.1)
Sodium: 138 mmol/L (ref 135–145)
Total Bilirubin: 0.3 mg/dL (ref 0.3–1.2)
Total Protein: 7.4 g/dL (ref 6.5–8.1)

## 2020-12-02 LAB — I-STAT BETA HCG BLOOD, ED (MC, WL, AP ONLY): I-stat hCG, quantitative: <5 m[IU]/mL (ref <5)

## 2020-12-02 LAB — LIPASE, BLOOD: Lipase: 38 U/L (ref 11–51)

## 2020-12-02 MED ORDER — LIDOCAINE-EPINEPHRINE (PF) 2 %-1:200000 IJ SOLN: 20.0000 mL | Freq: Once | INTRAMUSCULAR | Status: DC

## 2020-12-02 MED ORDER — LORAZEPAM 2 MG/ML IJ SOLN
1.0000 mg | Freq: Once | INTRAMUSCULAR | Status: AC
  Administered 2020-12-02: 1 mg via INTRAVENOUS
  Filled 2020-12-02: qty 1

## 2020-12-02 MED ORDER — PROCHLORPERAZINE EDISYLATE 10 MG/2ML IJ SOLN
10.0000 mg | Freq: Once | INTRAMUSCULAR | Status: AC
  Administered 2020-12-02: 10 mg via INTRAVENOUS
  Filled 2020-12-02: qty 2

## 2020-12-02 MED ORDER — SODIUM CHLORIDE 0.9 % IV BOLUS
3000.0000 mL | Freq: Once | INTRAVENOUS | Status: AC
  Administered 2020-12-02: 1000 mL via INTRAVENOUS

## 2020-12-02 MED ORDER — PROCHLORPERAZINE MALEATE 10 MG PO TABS: 10.0000 mg | ORAL_TABLET | Freq: Three times a day (TID) | ORAL | 0 refills | Status: DC | PRN

## 2020-12-02 MED ORDER — ONDANSETRON 4 MG PO TBDP
4.0000 mg | ORAL_TABLET | Freq: Once | ORAL | Status: AC
  Administered 2020-12-02: 4 mg via ORAL
  Filled 2020-12-02: qty 1

## 2020-12-02 NOTE — ED Provider Notes (Signed)
Emergency Medicine Provider Triage Evaluation Note  Autumn Vaughn , a 25 y.o. female  was evaluated in triage.  Pt complains of total back pain and lower abdominal pain onset around 1 AM.  Patient reports it woke her from sleep.  Since that time she has had 2 episodes of nonbloody nonbilious emesis.  Patient reports the pain is severe.  Denies previous history of same.  Denies previous history of abdominal surgeries.  No aggravating or alleviating factors.  Patient took naproxen prior to arrival without relief.  Review of Systems  Positive: Abdominal pain, back pain, nausea, vomiting Negative: Dysuria, hematuria syncope  Physical Exam  BP (!) 124/93 (BP Location: Right Arm)   Pulse 71   Temp (!) 97.5 F (36.4 C) (Oral)   Resp (!) 24   Ht 5\' 4"  (1.626 m)   SpO2 100%   BMI 24.03 kg/m  Gen:   Awake, no distress   Resp:  Normal effort, tachypneic and anxious MSK:   Moves extremities without difficulty  Other:  Tender to palpation along the lower abdomen.  No CVA tenderness.  Medical Decision Making  Medically screening exam initiated at 4:57 AM.  Appropriate orders placed.  Autumn Vaughn was informed that the remainder of the evaluation will be completed by another provider, this initial triage assessment does not replace that evaluation, and the importance of remaining in the ED until their evaluation is complete.  Back pain and abdominal pain with vomiting.  Labs and imaging pending.   Autumn Vaughn, Rocky Link 12/02/20 12/04/20    [id number], MD 12/03/20 0500

## 2020-12-02 NOTE — ED Notes (Signed)
Pt vomiting in waiting room.

## 2020-12-02 NOTE — Discharge Instructions (Signed)
There were no serious problems found associated with your nausea, vomiting and abdominal pain.  The test did not show any serious infection or metabolic problems.  Start with a clear liquid diet then gradually advance to regular foods after a day or 2.  We sent a prescription for nausea medicine to your pharmacy.  Do not drive or drink alcohol when taking it.  Follow-up with a primary care doctor if you are not better in a few days.

## 2020-12-02 NOTE — ED Provider Notes (Signed)
[facility] EMERGENCY DEPARTMENT Provider Note   CSN: [id number] Arrival date & time: 12/02/20  0401     History Chief Complaint  Patient presents with   Abdominal Pain   Back Pain    Autumn Vaughn is a 25 y.o. female.  HPI She presents for evaluation of back pain and abdominal pain which started during the night, awakening her from sleep.  After that she began to vomit.  She is worried about food poisoning after eating at [facility] last night.  She denies diarrhea, fever, chills, cough, shortness of breath or chest pain.       Past Medical History:  Diagnosis Date   Migraine     There are no problems to display for this patient.   History reviewed. No pertinent surgical history.   OB History   No obstetric history on file.     No family history on file.  Social History   Tobacco Use   Smoking status: Never   Smokeless tobacco: Never  Vaping Use   Vaping Use: Never used  Substance Use Topics   Alcohol use: No   Drug use: No    Home Medications Prior to Admission medications   Medication Sig Start Date End Date Taking? Authorizing Provider  prochlorperazine (COMPAZINE) 10 MG tablet Take 1 tablet (10 mg total) by mouth every 8 (eight) hours as needed for nausea or vomiting. 12/02/20  Yes Mancel Bale, MD  doxycycline (ORACEA) 40 MG capsule Take 40 mg by mouth every morning.    [provider]  Famotidine (PEPCID PO) Take by mouth.    [provider]  FLUoxetine HCl (PROZAC PO) Take by mouth.    [provider]  ondansetron (ZOFRAN) 4 MG tablet Take 1 tablet (4 mg total) by mouth every 6 (six) hours. 03/29/18   Arlyn Dunning, PA-C    Allergies    Patient has no known allergies.  Review of Systems   Review of Systems  All other systems reviewed and are negative.  Physical Exam Updated Vital Signs BP 121/80   Pulse 74   Temp (!) 97.5 F (36.4 C) (Oral)   Resp (!) 21   Ht 5\' 4"  (1.626 m)   SpO2 99%    BMI 24.03 kg/m   Physical Exam Vitals and nursing note reviewed.  Constitutional:      General: She is not in acute distress.    Appearance: She is well-developed. She is not ill-appearing, toxic-appearing or diaphoretic.  HENT:     Head: Normocephalic and atraumatic.     Right Ear: External ear normal.     Left Ear: External ear normal.  Eyes:     Conjunctiva/sclera: Conjunctivae normal.     Pupils: Pupils are equal, round, and reactive to light.  Neck:     Trachea: Phonation normal.  Cardiovascular:     Rate and Rhythm: Normal rate and regular rhythm.     Heart sounds: Normal heart sounds.  Pulmonary:     Effort: Pulmonary effort is normal.     Breath sounds: Normal breath sounds.  Abdominal:     General: There is no distension.     Palpations: Abdomen is soft. There is no mass.     Tenderness: There is no abdominal tenderness (Diffuse, mild).  Musculoskeletal:        General: Normal range of motion.     Cervical back: Normal range of motion and neck supple.  Skin:    General: Skin is  warm and dry.  Neurological:     Mental Status: She is alert and oriented to person, place, and time.     Cranial Nerves: No cranial nerve deficit.     Sensory: No sensory deficit.     Motor: No abnormal muscle tone.     Coordination: Coordination normal.  Psychiatric:        Mood and Affect: Mood normal.        Behavior: Behavior normal.        Thought Content: Thought content normal.        Judgment: Judgment normal.    ED Results / Procedures / Treatments   Labs (all labs ordered are listed, but only abnormal results are displayed) Labs Reviewed  CBC WITH DIFFERENTIAL/PLATELET - Abnormal; Notable for the following components:      Result Value   WBC 12.0 (*)    Neutro Abs 9.5 (*)    All other components within normal limits  COMPREHENSIVE METABOLIC PANEL - Abnormal; Notable for the following components:   Potassium 3.4 (*)    CO2 19 (*)    Glucose, Bld 125 (*)    All  other components within normal limits  URINALYSIS, ROUTINE W REFLEX MICROSCOPIC - Abnormal; Notable for the following components:   APPearance HAZY (*)    Ketones, ur 5 (*)    All other components within normal limits  LIPASE, BLOOD  I-STAT BETA HCG BLOOD, ED ([facility], [facility], [facility] ONLY)    EKG None  Radiology CT Renal Stone Study  Result Date: 12/02/2020 CLINICAL DATA:  25 year old female with flank and low back pain. EXAM: CT ABDOMEN AND PELVIS WITHOUT CONTRAST TECHNIQUE: Multidetector CT imaging of the abdomen and pelvis was performed following the standard protocol without IV contrast. COMPARISON:  None. FINDINGS: Lower chest: Negative; minimal left costophrenic angle atelectasis suspected. Hepatobiliary: Negative noncontrast liver and gallbladder. Pancreas: Negative. Spleen: Negative. Adrenals/Urinary Tract: Normal adrenal glands. Noncontrast kidneys appears symmetric and normal. No hydronephrosis. No convincing nephrolithiasis. No pararenal inflammation. Proximal ureters are decompressed. Distal ureters are sometimes difficult to delineate, and there are 2 bilateral pelvic phleboliths, but there is no evidence of a distal ureteral calculus. Unremarkable bladder. Stomach/Bowel: Somewhat redundant large bowel with hyperdense retained stool throughout much of the colon. Normal retrocecal appendix on series 2, image 51. No large bowel inflammation. Decompressed terminal ileum. No dilated small bowel. Stomach and duodenum appear negative. No free air, free fluid, mesenteric inflammation. Vascular/Lymphatic: Normal caliber abdominal aorta. No calcified atherosclerosis or lymphadenopathy identified. Reproductive: Negative noncontrast appearance. Other: No pelvic free fluid. Musculoskeletal: Negative. IMPRESSION: No urinary calculus or obstructive uropathy. Normal appendix. Negative non-contrast abdomen and pelvis. Electronically Signed   By: Odessa Fleming M.D.   On: 12/02/2020 07:18    Procedures Procedures    Medications Ordered in ED Medications  ondansetron (ZOFRAN-ODT) disintegrating tablet 4 mg (4 mg Oral Given 12/02/20 0522)  sodium chloride 0.9 % bolus 3,000 mL (0 mLs Intravenous Stopped 12/02/20 1140)  LORazepam (ATIVAN) injection 1 mg (1 mg Intravenous Given 12/02/20 0920)  prochlorperazine (COMPAZINE) injection 10 mg (10 mg Intravenous Given 12/02/20 1740)    ED Course  I have reviewed the triage vital signs and the nursing notes.  Pertinent labs & imaging results that were available during my care of the patient were reviewed by me and considered in my medical decision making (see chart for details).    MDM Rules/Calculators/A&P  Patient Vitals for the past 24 hrs:  BP Temp Temp src Pulse Resp SpO2 Height  12/02/20 1200 121/80 -- -- 74 (!) 21 99 % --  12/02/20 1130 132/84 -- -- 78 16 100 % --  12/02/20 1110 122/82 -- -- 91 18 99 % --  12/02/20 1000 123/86 -- -- 65 20 100 % --  12/02/20 0930 128/73 -- -- 64 13 92 % --  12/02/20 0900 131/84 -- -- 75 (!) 22 100 % --  12/02/20 0741 124/79 -- -- 81 17 100 % --  12/02/20 0450 (!) 124/93 (!) 97.5 F (36.4 C) Oral 71 (!) 24 100 % --  12/02/20 0449 -- -- -- -- -- -- 5\' 4"  (1.626 m)    12:41 PM Reevaluation with update and discussion. After initial assessment and treatment, an updated evaluation reveals mother in the room with her now.  We discussed the findings and treatment plan.  Patient is appreciative and states she feels better and is ready to go home.  All questions answered.   Medical Decision Making:  This patient is presenting for evaluation of nausea and vomiting, which does require a range of treatment options, and is a complaint that involves a moderate risk of morbidity and mortality. The differential diagnoses include gastritis, enteritis, food intolerance, food poisoning, UTI. I decided to review old records, and in summary Young adult female presenting with nonspecific  symptoms, requiring evaluation and treatment..  I did require additional historical information from anyone.  Clinical Laboratory Tests Ordered, included CBC, Metabolic panel, Urinalysis, Pregnancy test, and lipase . Review indicates normal finding, except potassium slightly low, CO2 low, glucose mildly elevated. Radiologic Tests Ordered, included CT abdomen pelvis.  I independently Visualized: Radiographic images, which show no acute abnormality   Critical Interventions- Clinical evaluation, laboratory testing, medication treatment, IV fluids, observation and reassessment  After These Interventions, the Patient was reevaluated and was found No Further complaints.  She feels well enough to go home  CRITICAL CARE- No Performed by: Mancel Bale  Nursing Notes Reviewed/ Care Coordinated Applicable Imaging Reviewed Interpretation of Laboratory Data incorporated into ED treatment  The patient appears reasonably screened and/or stabilized for discharge and I doubt any other medical condition or other [facility] requiring further screening, evaluation, or treatment in the ED at this time prior to discharge.  Plan: Home Medications- OTC prn; Home Treatments- Gradually advance diet; return here if the recommended treatment, does not improve the symptoms; Recommended follow up- PCP prn     Final Clinical Impression(s) / ED Diagnoses Final diagnoses:  Nausea and vomiting, unspecified vomiting type  Pain of upper abdomen    Rx / DC Orders ED Discharge Orders          Ordered    prochlorperazine (COMPAZINE) 10 MG tablet  Every 8 hours PRN        12/02/20 1246             12/04/20, MD 12/02/20 1836

## 2020-12-02 NOTE — ED Triage Notes (Signed)
Pt reports back pain and abdominal pain.  She is hyperventilating in triage, stays she can't stop.

## 2020-12-04 DIAGNOSIS — G4720 Circadian rhythm sleep disorder, unspecified type: Secondary | ICD-10-CM | POA: Diagnosis not present

## 2020-12-04 DIAGNOSIS — F331 Major depressive disorder, recurrent, moderate: Secondary | ICD-10-CM | POA: Diagnosis not present

## 2020-12-04 DIAGNOSIS — F411 Generalized anxiety disorder: Secondary | ICD-10-CM | POA: Diagnosis not present

## 2020-12-05 DIAGNOSIS — F331 Major depressive disorder, recurrent, moderate: Secondary | ICD-10-CM | POA: Diagnosis not present

## 2020-12-05 DIAGNOSIS — F411 Generalized anxiety disorder: Secondary | ICD-10-CM | POA: Diagnosis not present

## 2020-12-06 DIAGNOSIS — L700 Acne vulgaris: Secondary | ICD-10-CM | POA: Diagnosis not present

## 2020-12-12 DIAGNOSIS — F331 Major depressive disorder, recurrent, moderate: Secondary | ICD-10-CM | POA: Diagnosis not present

## 2020-12-12 DIAGNOSIS — F411 Generalized anxiety disorder: Secondary | ICD-10-CM | POA: Diagnosis not present

## 2020-12-19 DIAGNOSIS — F331 Major depressive disorder, recurrent, moderate: Secondary | ICD-10-CM | POA: Diagnosis not present

## 2020-12-19 DIAGNOSIS — F411 Generalized anxiety disorder: Secondary | ICD-10-CM | POA: Diagnosis not present

## 2020-12-26 DIAGNOSIS — F331 Major depressive disorder, recurrent, moderate: Secondary | ICD-10-CM | POA: Diagnosis not present

## 2020-12-26 DIAGNOSIS — F411 Generalized anxiety disorder: Secondary | ICD-10-CM | POA: Diagnosis not present

## 2020-12-31 DIAGNOSIS — Z79899 Other long term (current) drug therapy: Secondary | ICD-10-CM | POA: Diagnosis not present

## 2020-12-31 DIAGNOSIS — L700 Acne vulgaris: Secondary | ICD-10-CM | POA: Diagnosis not present

## 2020-12-31 DIAGNOSIS — Z23 Encounter for immunization: Secondary | ICD-10-CM | POA: Diagnosis not present

## 2021-01-06 DIAGNOSIS — Z23 Encounter for immunization: Secondary | ICD-10-CM | POA: Diagnosis not present

## 2021-01-06 DIAGNOSIS — F419 Anxiety disorder, unspecified: Secondary | ICD-10-CM | POA: Diagnosis not present

## 2021-01-06 DIAGNOSIS — F331 Major depressive disorder, recurrent, moderate: Secondary | ICD-10-CM | POA: Diagnosis not present

## 2021-01-06 DIAGNOSIS — G43519 Persistent migraine aura without cerebral infarction, intractable, without status migrainosus: Secondary | ICD-10-CM | POA: Diagnosis not present

## 2021-01-09 DIAGNOSIS — F411 Generalized anxiety disorder: Secondary | ICD-10-CM | POA: Diagnosis not present

## 2021-01-09 DIAGNOSIS — F331 Major depressive disorder, recurrent, moderate: Secondary | ICD-10-CM | POA: Diagnosis not present

## 2021-01-16 DIAGNOSIS — G43009 Migraine without aura, not intractable, without status migrainosus: Secondary | ICD-10-CM | POA: Diagnosis not present

## 2021-01-23 DIAGNOSIS — F411 Generalized anxiety disorder: Secondary | ICD-10-CM | POA: Diagnosis not present

## 2021-01-23 DIAGNOSIS — F331 Major depressive disorder, recurrent, moderate: Secondary | ICD-10-CM | POA: Diagnosis not present

## 2021-02-06 DIAGNOSIS — F331 Major depressive disorder, recurrent, moderate: Secondary | ICD-10-CM | POA: Diagnosis not present

## 2021-02-06 DIAGNOSIS — F411 Generalized anxiety disorder: Secondary | ICD-10-CM | POA: Diagnosis not present

## 2021-02-18 DIAGNOSIS — F4321 Adjustment disorder with depressed mood: Secondary | ICD-10-CM | POA: Diagnosis not present

## 2021-02-18 DIAGNOSIS — G4720 Circadian rhythm sleep disorder, unspecified type: Secondary | ICD-10-CM | POA: Diagnosis not present

## 2021-02-18 DIAGNOSIS — F411 Generalized anxiety disorder: Secondary | ICD-10-CM | POA: Diagnosis not present

## 2021-02-18 DIAGNOSIS — F331 Major depressive disorder, recurrent, moderate: Secondary | ICD-10-CM | POA: Diagnosis not present

## 2021-02-20 DIAGNOSIS — L700 Acne vulgaris: Secondary | ICD-10-CM | POA: Diagnosis not present

## 2021-02-20 DIAGNOSIS — F331 Major depressive disorder, recurrent, moderate: Secondary | ICD-10-CM | POA: Diagnosis not present

## 2021-02-20 DIAGNOSIS — Z23 Encounter for immunization: Secondary | ICD-10-CM | POA: Diagnosis not present

## 2021-02-20 DIAGNOSIS — Z79899 Other long term (current) drug therapy: Secondary | ICD-10-CM | POA: Diagnosis not present

## 2021-02-20 DIAGNOSIS — F411 Generalized anxiety disorder: Secondary | ICD-10-CM | POA: Diagnosis not present

## 2021-03-18 DIAGNOSIS — F331 Major depressive disorder, recurrent, moderate: Secondary | ICD-10-CM | POA: Diagnosis not present

## 2021-03-18 DIAGNOSIS — F4321 Adjustment disorder with depressed mood: Secondary | ICD-10-CM | POA: Diagnosis not present

## 2021-03-18 DIAGNOSIS — F411 Generalized anxiety disorder: Secondary | ICD-10-CM | POA: Diagnosis not present

## 2021-03-18 DIAGNOSIS — G4720 Circadian rhythm sleep disorder, unspecified type: Secondary | ICD-10-CM | POA: Diagnosis not present

## 2021-03-20 DIAGNOSIS — F411 Generalized anxiety disorder: Secondary | ICD-10-CM | POA: Diagnosis not present

## 2021-03-20 DIAGNOSIS — F331 Major depressive disorder, recurrent, moderate: Secondary | ICD-10-CM | POA: Diagnosis not present

## 2021-03-24 DIAGNOSIS — L700 Acne vulgaris: Secondary | ICD-10-CM | POA: Diagnosis not present

## 2021-03-24 DIAGNOSIS — Z79899 Other long term (current) drug therapy: Secondary | ICD-10-CM | POA: Diagnosis not present

## 2021-04-03 DIAGNOSIS — F331 Major depressive disorder, recurrent, moderate: Secondary | ICD-10-CM | POA: Diagnosis not present

## 2021-04-03 DIAGNOSIS — G4720 Circadian rhythm sleep disorder, unspecified type: Secondary | ICD-10-CM | POA: Diagnosis not present

## 2021-04-03 DIAGNOSIS — F411 Generalized anxiety disorder: Secondary | ICD-10-CM | POA: Diagnosis not present

## 2021-04-15 DIAGNOSIS — F411 Generalized anxiety disorder: Secondary | ICD-10-CM | POA: Diagnosis not present

## 2021-04-15 DIAGNOSIS — G4720 Circadian rhythm sleep disorder, unspecified type: Secondary | ICD-10-CM | POA: Diagnosis not present

## 2021-04-15 DIAGNOSIS — F331 Major depressive disorder, recurrent, moderate: Secondary | ICD-10-CM | POA: Diagnosis not present

## 2021-04-15 DIAGNOSIS — F4321 Adjustment disorder with depressed mood: Secondary | ICD-10-CM | POA: Diagnosis not present

## 2021-04-17 DIAGNOSIS — F411 Generalized anxiety disorder: Secondary | ICD-10-CM | POA: Diagnosis not present

## 2021-04-17 DIAGNOSIS — F331 Major depressive disorder, recurrent, moderate: Secondary | ICD-10-CM | POA: Diagnosis not present

## 2021-04-24 DIAGNOSIS — Z79899 Other long term (current) drug therapy: Secondary | ICD-10-CM | POA: Diagnosis not present

## 2021-04-24 DIAGNOSIS — L700 Acne vulgaris: Secondary | ICD-10-CM | POA: Diagnosis not present

## 2021-05-01 DIAGNOSIS — F411 Generalized anxiety disorder: Secondary | ICD-10-CM | POA: Diagnosis not present

## 2021-05-01 DIAGNOSIS — F331 Major depressive disorder, recurrent, moderate: Secondary | ICD-10-CM | POA: Diagnosis not present

## 2021-05-07 DIAGNOSIS — R42 Dizziness and giddiness: Secondary | ICD-10-CM | POA: Diagnosis not present

## 2021-05-07 DIAGNOSIS — F419 Anxiety disorder, unspecified: Secondary | ICD-10-CM | POA: Diagnosis not present

## 2021-05-07 DIAGNOSIS — F331 Major depressive disorder, recurrent, moderate: Secondary | ICD-10-CM | POA: Diagnosis not present

## 2021-05-07 DIAGNOSIS — G43109 Migraine with aura, not intractable, without status migrainosus: Secondary | ICD-10-CM | POA: Diagnosis not present

## 2021-05-07 DIAGNOSIS — G44209 Tension-type headache, unspecified, not intractable: Secondary | ICD-10-CM | POA: Diagnosis not present

## 2021-05-15 DIAGNOSIS — F411 Generalized anxiety disorder: Secondary | ICD-10-CM | POA: Diagnosis not present

## 2021-05-15 DIAGNOSIS — F331 Major depressive disorder, recurrent, moderate: Secondary | ICD-10-CM | POA: Diagnosis not present

## 2021-05-26 DIAGNOSIS — Z79899 Other long term (current) drug therapy: Secondary | ICD-10-CM | POA: Diagnosis not present

## 2021-05-26 DIAGNOSIS — L700 Acne vulgaris: Secondary | ICD-10-CM | POA: Diagnosis not present

## 2021-05-27 DIAGNOSIS — F331 Major depressive disorder, recurrent, moderate: Secondary | ICD-10-CM | POA: Diagnosis not present

## 2021-05-27 DIAGNOSIS — F411 Generalized anxiety disorder: Secondary | ICD-10-CM | POA: Diagnosis not present

## 2021-05-27 DIAGNOSIS — G4720 Circadian rhythm sleep disorder, unspecified type: Secondary | ICD-10-CM | POA: Diagnosis not present

## 2021-05-27 DIAGNOSIS — F4321 Adjustment disorder with depressed mood: Secondary | ICD-10-CM | POA: Diagnosis not present

## 2021-06-09 ENCOUNTER — Encounter: Payer: Self-pay | Admitting: Neurology

## 2021-06-09 ENCOUNTER — Ambulatory Visit: Payer: BC Managed Care – PPO | Admitting: Neurology

## 2021-06-23 DIAGNOSIS — G4720 Circadian rhythm sleep disorder, unspecified type: Secondary | ICD-10-CM | POA: Diagnosis not present

## 2021-06-23 DIAGNOSIS — F331 Major depressive disorder, recurrent, moderate: Secondary | ICD-10-CM | POA: Diagnosis not present

## 2021-06-23 DIAGNOSIS — F411 Generalized anxiety disorder: Secondary | ICD-10-CM | POA: Diagnosis not present

## 2021-06-25 DIAGNOSIS — L700 Acne vulgaris: Secondary | ICD-10-CM | POA: Diagnosis not present

## 2021-06-25 DIAGNOSIS — Z79899 Other long term (current) drug therapy: Secondary | ICD-10-CM | POA: Diagnosis not present

## 2021-07-14 DIAGNOSIS — F331 Major depressive disorder, recurrent, moderate: Secondary | ICD-10-CM | POA: Diagnosis not present

## 2021-07-14 DIAGNOSIS — G4720 Circadian rhythm sleep disorder, unspecified type: Secondary | ICD-10-CM | POA: Diagnosis not present

## 2021-07-14 DIAGNOSIS — F411 Generalized anxiety disorder: Secondary | ICD-10-CM | POA: Diagnosis not present

## 2021-07-29 DIAGNOSIS — Z79899 Other long term (current) drug therapy: Secondary | ICD-10-CM | POA: Diagnosis not present

## 2021-07-29 DIAGNOSIS — L700 Acne vulgaris: Secondary | ICD-10-CM | POA: Diagnosis not present

## 2021-07-29 DIAGNOSIS — L910 Hypertrophic scar: Secondary | ICD-10-CM | POA: Diagnosis not present

## 2021-08-15 DIAGNOSIS — G4720 Circadian rhythm sleep disorder, unspecified type: Secondary | ICD-10-CM | POA: Diagnosis not present

## 2021-08-15 DIAGNOSIS — F331 Major depressive disorder, recurrent, moderate: Secondary | ICD-10-CM | POA: Diagnosis not present

## 2021-08-15 DIAGNOSIS — F411 Generalized anxiety disorder: Secondary | ICD-10-CM | POA: Diagnosis not present

## 2021-08-28 DIAGNOSIS — L700 Acne vulgaris: Secondary | ICD-10-CM | POA: Diagnosis not present

## 2021-08-28 DIAGNOSIS — Z79899 Other long term (current) drug therapy: Secondary | ICD-10-CM | POA: Diagnosis not present

## 2021-09-19 DIAGNOSIS — F411 Generalized anxiety disorder: Secondary | ICD-10-CM | POA: Diagnosis not present

## 2021-09-19 DIAGNOSIS — G4720 Circadian rhythm sleep disorder, unspecified type: Secondary | ICD-10-CM | POA: Diagnosis not present

## 2021-09-19 DIAGNOSIS — F331 Major depressive disorder, recurrent, moderate: Secondary | ICD-10-CM | POA: Diagnosis not present

## 2021-09-29 DIAGNOSIS — L910 Hypertrophic scar: Secondary | ICD-10-CM | POA: Diagnosis not present

## 2021-09-29 DIAGNOSIS — L209 Atopic dermatitis, unspecified: Secondary | ICD-10-CM | POA: Diagnosis not present

## 2021-09-29 DIAGNOSIS — Z79899 Other long term (current) drug therapy: Secondary | ICD-10-CM | POA: Diagnosis not present

## 2021-09-29 DIAGNOSIS — L700 Acne vulgaris: Secondary | ICD-10-CM | POA: Diagnosis not present

## 2021-10-19 DIAGNOSIS — S99911A Unspecified injury of right ankle, initial encounter: Secondary | ICD-10-CM | POA: Diagnosis not present

## 2021-10-19 DIAGNOSIS — M25572 Pain in left ankle and joints of left foot: Secondary | ICD-10-CM | POA: Diagnosis not present

## 2021-10-24 DIAGNOSIS — G4720 Circadian rhythm sleep disorder, unspecified type: Secondary | ICD-10-CM | POA: Diagnosis not present

## 2021-10-24 DIAGNOSIS — F411 Generalized anxiety disorder: Secondary | ICD-10-CM | POA: Diagnosis not present

## 2021-10-24 DIAGNOSIS — F331 Major depressive disorder, recurrent, moderate: Secondary | ICD-10-CM | POA: Diagnosis not present

## 2021-10-31 DIAGNOSIS — Z79899 Other long term (current) drug therapy: Secondary | ICD-10-CM | POA: Diagnosis not present

## 2021-10-31 DIAGNOSIS — L700 Acne vulgaris: Secondary | ICD-10-CM | POA: Diagnosis not present

## 2021-11-10 DIAGNOSIS — F419 Anxiety disorder, unspecified: Secondary | ICD-10-CM | POA: Diagnosis not present

## 2021-11-10 DIAGNOSIS — F331 Major depressive disorder, recurrent, moderate: Secondary | ICD-10-CM | POA: Diagnosis not present

## 2021-11-10 DIAGNOSIS — G43109 Migraine with aura, not intractable, without status migrainosus: Secondary | ICD-10-CM | POA: Diagnosis not present

## 2022-02-25 DIAGNOSIS — Z0289 Encounter for other administrative examinations: Secondary | ICD-10-CM

## 2022-04-29 ENCOUNTER — Ambulatory Visit: Payer: BC Managed Care – PPO | Admitting: Neurology

## 2022-04-29 ENCOUNTER — Encounter: Payer: Self-pay | Admitting: Neurology

## 2022-04-29 VITALS — BP 121/76 | HR 74 | Ht 65.0 in | Wt 192.0 lb

## 2022-04-29 DIAGNOSIS — F419 Anxiety disorder, unspecified: Secondary | ICD-10-CM | POA: Diagnosis not present

## 2022-04-29 DIAGNOSIS — R5383 Other fatigue: Secondary | ICD-10-CM

## 2022-04-29 DIAGNOSIS — F32A Depression, unspecified: Secondary | ICD-10-CM | POA: Diagnosis not present

## 2022-04-29 DIAGNOSIS — G43709 Chronic migraine without aura, not intractable, without status migrainosus: Secondary | ICD-10-CM | POA: Diagnosis not present

## 2022-04-29 MED ORDER — AJOVY 225 MG/1.5ML ~~LOC~~ SOAJ: 225.0000 mg | SUBCUTANEOUS | 11 refills | Status: AC

## 2022-04-29 MED ORDER — ONDANSETRON HCL 4 MG PO TABS: 4.0000 mg | ORAL_TABLET | Freq: Four times a day (QID) | ORAL | 11 refills | Status: DC

## 2022-04-29 MED ORDER — NARATRIPTAN HCL 2.5 MG PO TABS: 2.5000 mg | ORAL_TABLET | ORAL | 11 refills | Status: DC | PRN

## 2022-04-29 MED ORDER — VENLAFAXINE HCL ER 75 MG PO CP24: ORAL_CAPSULE | ORAL | 3 refills | Status: DC

## 2022-04-29 NOTE — Progress Notes (Signed)
[id number]    Provider:  Dr Jaynee Eagles Requesting Provider: Harlan Stains, MD Primary Care Provider:  Harlan Stains, MD  CC:  migraines  HPI:  Autumn Vaughn is a 27 y.o. female here as requested by Harlan Stains, MD for migraines. PMHx migraines, panic attacks, chronic headaches, depression, tension headache, anxiety, migraine, stress, migraine aura, allergy. She has always had headaches, mood/irritability ismigraine prodrom since she was a child, in high [place] concussion made migraines worse (already had them as a child), mother with migraines. Lights are a trigger, looking into lights can trigger visual changes she describes as aura, she has a lot of nausea, vomiting with the migraines, pulsating/pounding/throbbing, photo/phonophobia, hurts to move, last 24-72 hours and be moderate to severe she had to been to the ED for dehydration due to vomiting so much, ongoing for years, daily headaches at least > 15 migraines that can be moderate to severe and last 24-72 hours. No new weakness, no new vision changes, not positional or exertional, no changes in quality or severity. No red flags, no indication for imaging but low threshold discussed. Ubrelvy helped. Nurtec did not. No other focal neurologic deficits, associated symptoms, inciting events or modifiable factors. Ajovy worked great we can try again.   She is here alone, lovely, needs a psychiatrist for depression and anxiety will refer to lisa poulos.   Reviewed notes, labs and imaging from outside physicians, which showed:  Reviewed [facility]'s records from 2022 as follows:  Current and past medications: ANALGESICS: Tylenol ANTI-MIGRAINE: Maxalt, Imitrex, Nurtec been ineffective, Ubrelvy helped HEART/BP: blood presssure meds contraindicated due to hypotension DECONGESTANT/ANTIHISTAMINE: Allegra ANTI-NAUSEANT: Zofran NSAIDS: Aleve, naproxen MUSCLE RELAXANTS: Flexeril, baclofen ANTI-CONVULSANTS: Topamax,  Zonegran STEROIDS: SLEEPING PILLS/TRANQUILIZERS: melatonin ANTI-DEPRESSANTS: Effexor, Prozac, amitriptyline, lexapro,  HERBAL: Magnesium, riboflavin FIBROMYALGIA:  HORMONAL: OTHER: Ajovy PROCEDURES FOR HEADACHES:   MRI brain 01/10/2013: EXAM:  MRI HEAD WITHOUT CONTRAST   TECHNIQUE:  Multiplanar, multiecho pulse sequences of the brain and surrounding  structures were obtained without intravenous contrast.   COMPARISON:  None.   FINDINGS:  Ventricle size is normal. Negative for Chiari malformation.  Craniocervical junction is normal. Pituitary is normal in size.   Negative for acute or chronic infarction. Negative for demyelinating  disease. Negative for hemorrhage or mass lesion. No fluid collection  or midline shift.   Paranasal sinuses are clear.   IMPRESSION:  Normal      Latest Ref Rng & Units 04/29/2022    2:45 PM 12/02/2020    5:15 AM 03/29/2018   10:28 PM  CBC  WBC 3.4 - 10.8 x10E3/uL 9.1  12.0  8.4   Hemoglobin 11.1 - 15.9 g/dL 12.9  13.0  13.0   Hematocrit 34.0 - 46.6 % 39.6  39.4  41.0   Platelets 150 - 450 x10E3/uL 291  275  232       Latest Ref Rng & Units 04/29/2022    2:45 PM 12/02/2020    5:15 AM 03/29/2018   10:28 PM  CMP  Glucose 70 - 99 mg/dL 94  125  90   BUN 6 - 20 mg/dL 12  13  11    Creatinine 0.57 - 1.00 mg/dL 0.75  0.78  0.64   Sodium 134 - 144 mmol/L 140  138  134   Potassium 3.5 - 5.2 mmol/L 4.3  3.4  3.6   Chloride 96 - 106 mmol/L 103  107  101   CO2 20 - 29 mmol/L 24  19  24    Calcium 8.7 -  10.2 mg/dL 9.6  9.7  9.8   Total Protein 6.0 - 8.5 g/dL 7.4  7.4  8.0   Total Bilirubin 0.0 - 1.2 mg/dL 0.3  0.3  0.7   Alkaline Phos 44 - 121 IU/L 94  67  66   AST 0 - 40 IU/L 17  19  16    ALT 0 - 32 IU/L 16  15  17        Review of Systems: Patient complains of symptoms per HPI as well as the following symptoms migraines. Pertinent negatives and positives per HPI. All others negative.   Social History   Socioeconomic History   Marital  status: Single    Spouse name: Not on file   Number of children: Not on file   Years of education: Not on file   Highest education level: Not on file  Occupational History   Not on file  Tobacco Use   Smoking status: Never   Smokeless tobacco: Never  Vaping Use   Vaping Use: Never used  Substance and Sexual Activity   Alcohol use: No   Drug use: No   Sexual activity: Not on file  Other Topics Concern   Not on file  Social History Narrative   Not on file   Social Determinants of Health   Financial Resource Strain: Not on file  Food Insecurity: Not on file  Transportation Needs: Not on file  Physical Activity: Not on file  Stress: Not on file  Social Connections: Not on file  Intimate Partner Violence: Not on file    Family History  Problem Relation Age of Onset   Migraines Mother    Migraines Cousin     Past Medical History:  Diagnosis Date   Migraine     Patient Active Problem List   Diagnosis Date Noted   Chronic migraine without aura without status migrainosus, not intractable 05/04/2022   Depression 05/04/2022   Anxiety 05/04/2022   Other fatigue 05/04/2022    Past Surgical History:  Procedure Laterality Date   NO PAST SURGERIES      Current Outpatient Medications  Medication Sig Dispense Refill   Fremanezumab-vfrm (AJOVY) 225 MG/1.5ML SOAJ Inject 225 mg into the skin every 30 (thirty) days. BIN#: [id number] PCN#: PDMI GROUP#: [id number] ID#: [id number] EXPIRES: 02/09/2023 1.5 mL 11   IBUPROFEN PO Take by mouth.     MAGNESIUM PO Take by mouth.     RIBOFLAVIN PO Take by mouth.     UBRELVY 100 MG TABS Take by mouth.     zonisamide (ZONEGRAN) 100 MG capsule Take 100 mg by mouth daily.     naratriptan (AMERGE) 2.5 MG tablet Take 1 tablet (2.5 mg total) by mouth as needed for migraine. Take one (1) tablet at onset of headache; if returns or does not resolve, may repeat after 2 hours; do not exceed five (5) mg in 24 hours. 9 tablet 11   ondansetron (ZOFRAN) 4  MG tablet Take 1 tablet (4 mg total) by mouth every 6 (six) hours. 20 tablet 11   venlafaxine XR (EFFEXOR-XR) 75 MG 24 hr capsule Take one daily 90 capsule 3   No current facility-administered medications for this visit.    Allergies as of 04/29/2022   (No Known Allergies)    Vitals: BP 121/76   Pulse 74   Ht 5\' 5"  (1.651 m)   Wt 192 lb (87.1 kg)   BMI 31.95 kg/m  Last Weight:  Wt Readings from Last 1 Encounters:  04/29/22  192 lb (87.1 kg)   Last Height:   Ht Readings from Last 1 Encounters:  04/29/22 5\' 5"  (1.651 m)     Physical exam: Exam: Gen: NAD, conversant, well nourised, obese, well groomed                     CV: RRR, no MRG. No Carotid Bruits. No peripheral edema, warm, nontender Eyes: Conjunctivae clear without exudates or hemorrhage  Neuro: Detailed Neurologic Exam  Speech:    Speech is normal; fluent and spontaneous with normal comprehension.  Cognition:    The patient is oriented to person, place, and time;     recent and remote memory intact;     language fluent;     normal attention, concentration,     fund of knowledge Cranial Nerves:    The pupils are equal, round, and reactive to light. The fundi are normal and spontaneous venous pulsations are present. Visual fields are full to finger confrontation. Extraocular movements are intact. Trigeminal sensation is intact and the muscles of mastication are normal. The face is symmetric. The palate elevates in the midline. Hearing intact. Voice is normal. Shoulder shrug is normal. The tongue has normal motion without fasciculations.   Coordination:    Normal finger to nose and heel to shin. Normal rapid alternating movements.   Gait:    Heel-toe and tandem gait are normal.   Motor Observation:    No asymmetry, no atrophy, and no involuntary movements noted. Tone:    Normal muscle tone.    Posture:    Posture is normal. normal erect    Strength:    Strength is V/V in the upper and lower limbs.       Sensation: intact to LT     Reflex Exam:  DTR's:    Deep tendon reflexes in the upper and lower extremities are normal bilaterally.   Toes:    The toes are downgoing bilaterally.   Clonus:    Clonus is absent.    Assessment/Plan:  1. Migraine without aura and without status migrainosus  She is here alone, lovely, needs a psychiatrist for depression and anxiety will refer to lisa poulos.   Ajovy: Come off of it 6 months prior to pregnancy, discussed teratogenicity No red flags, no indication for imaging but low threshold discussed. MRI of the brain in the past was normal per patient.  When migraines improved and qualifies can prescribe Ubrelvy as needed  Continue Ajovy: Come off of it 6 months prior to pregnancy, discussed teratogenicity - once monthly prevention In the past was on zonisamide don't want to do all at once can see next time if needed Continue ondansetron Not taking baclofen anymore, can see next time if needed Try naratriptan acutely, then when better we can go to [place] as needed Continue Venlafaxine Refer to Noemi Chapel in psychiatry  Discussed: "There is increased risk for stroke in women with migraine with aura and a contraindication for the combined contraceptive pill for use by women who have migraine with aura. The risk for women with migraine without aura is lower. However other risk factors like smoking are far more likely to increase stroke risk than migraine. There is a recommendation for no smoking and for the use of OCPs without estrogen such as progestogen only pills particularly for women with migraine with aura.Marland Kitchen People who have migraine headaches with auras may be 3 times more likely to have a stroke caused by a blood clot, compared to  migraine patients who don't see auras. Women who take hormone-replacement therapy may be 30 percent more likely to suffer a clot-based stroke than women not taking medication containing estrogen. Other risk factors like  smoking and high blood pressure may be  much more important.     Follow up in about 6 months (around 07/17/2021) for Office Visit.   Orders Placed This Encounter  Procedures   CBC with Differential/Platelets   Comprehensive metabolic panel   TSH Rfx on Abnormal to Free T4   Ambulatory referral to Psychiatry   Meds ordered this encounter  Medications   Fremanezumab-vfrm (AJOVY) 225 MG/1.5ML SOAJ    Sig: Inject 225 mg into the skin every 30 (thirty) days. BIN#: [id number] PCN#: PDMI GROUP#: [id number] ID#: [id number] EXPIRES: 02/09/2023    Dispense:  1.5 mL    Refill:  11    Patient has copay card; she can have medication regardless of insurance approval or copay amount. PLEASE RUN BIN#: [id number] PCN#: PDMI GROUP#: [id number] ID#: [id number] EXPIRES: 02/09/2023   DISCONTD: naratriptan (AMERGE) 2.5 MG tablet    Sig: Take 1 tablet (2.5 mg total) by mouth as needed for migraine. Take one (1) tablet at onset of headache; if returns or does not resolve, may repeat after 2 hours; do not exceed five (5) mg in 24 hours.    Dispense:  9 tablet    Refill:  11   naratriptan (AMERGE) 2.5 MG tablet    Sig: Take 1 tablet (2.5 mg total) by mouth as needed for migraine. Take one (1) tablet at onset of headache; if returns or does not resolve, may repeat after 2 hours; do not exceed five (5) mg in 24 hours.    Dispense:  9 tablet    Refill:  11   venlafaxine XR (EFFEXOR-XR) 75 MG 24 hr capsule    Sig: Take one daily    Dispense:  90 capsule    Refill:  3   ondansetron (ZOFRAN) 4 MG tablet    Sig: Take 1 tablet (4 mg total) by mouth every 6 (six) hours.    Dispense:  20 tablet    Refill:  11    Cc: Harlan Stains, MD,  Harlan Stains, MD  Sarina Ill, MD  [facility] [address] [place], [place] [address]  Phone [phone] Fax [phone]  I spent over 60 minutes of face-to-face and non-face-to-face time with patient on the  1. Chronic migraine without aura  without status migrainosus, not intractable   2. Depression, unspecified depression type   3. Anxiety   4. Other fatigue    diagnosis.  This included previsit chart review, lab review, study review, order entry, electronic health record documentation, patient education on the different diagnostic and therapeutic options, counseling and coordination of care, risks and benefits of management, compliance, or risk factor reduction

## 2022-04-29 NOTE — Patient Instructions (Addendum)
Continue Ajovy: Come off of it 6 months prior to pregnancy, discussed teratogenicity - once monthly prevention In the past was on zonisamide don't want to do all at once can see next time if needed Continue ondansetron Not taking baclofen anymore, can see next time if needed Try naratriptan acutely, then when better we can go to [place] as needed Continue Venlafaxine Refer to Noemi Chapel in psychiatry  "There is increased risk for stroke in women with migraine with aura and a contraindication for the combined contraceptive pill for use by women who have migraine with aura. The risk for women with migraine without aura is lower. However other risk factors like smoking are far more likely to increase stroke risk than migraine. There is a recommendation for no smoking and for the use of OCPs without estrogen such as progestogen only pills particularly for women with migraine with aura.Marland Kitchen People who have migraine headaches with auras may be 3 times more likely to have a stroke caused by a blood clot, compared to migraine patients who don't see auras. Women who take hormone-replacement therapy may be 30 percent more likely to suffer a clot-based stroke than women not taking medication containing estrogen. Other risk factors like smoking and high blood pressure may be  much more important.  Fremanezumab Injection What is this medication? FREMANEZUMAB (fre ma NEZ ue mab) prevents migraines. It works by blocking a substance in the body that causes migraines. It is a monoclonal antibody. This medicine may be used for other purposes; ask your health care provider or pharmacist if you have questions. COMMON BRAND NAME(S): AJOVY What should I tell my care team before I take this medication? They need to know if you have any of these conditions: An unusual or allergic reaction to fremanezumab, other medications, foods, dyes, or preservatives Pregnant or trying to get pregnant Breast-feeding How should I use  this medication? This medication is injected under the skin. You will be taught how to prepare and give it. Take it as directed on the prescription label. Keep taking it unless your care team tells you to stop. It is important that you put your used needles and syringes in a special sharps container. Do not put them in a trash can. If you do not have a sharps container, call your pharmacist or care team to get one. Talk to your care team about the use of this medication in children. Special care may be needed. Overdosage: If you think you have taken too much of this medicine contact a poison control center or emergency room at once. NOTE: This medicine is only for you. Do not share this medicine with others. What if I miss a dose? If you miss a dose, take it as soon as you can. If it is almost time for your next dose, take only that dose. Do not take double or extra doses. What may interact with this medication? Interactions are not expected. This list may not describe all possible interactions. Give your health care provider a list of all the medicines, herbs, non-prescription drugs, or dietary supplements you use. Also tell them if you smoke, drink alcohol, or use illegal drugs. Some items may interact with your medicine. What should I watch for while using this medication? Tell your care team if your symptoms do not start to get better or if they get worse. What side effects may I notice from receiving this medication? Side effects that you should report to your care team as soon as  possible: Allergic reactions or angioedema--skin rash, itching or hives, swelling of the face, eyes, lips, tongue, arms, or legs, trouble swallowing or breathing Side effects that usually do not require medical attention (report to your care team if they continue or are bothersome): Pain, redness, or irritation at injection site This list may not describe all possible side effects. Call your doctor for medical advice  about side effects. You may report side effects to FDA at 1-800-FDA-1088. Where should I keep my medication? Keep out of the reach of children and pets. Store in a refrigerator or at room temperature between 20 and 25 degrees C (68 and 77 degrees F). Refrigeration (preferred): Store in the refrigerator. Do not freeze. Keep in the original container until you are ready to take it. Remove the dose from the carton about 30 minutes before it is time for you to use it. If the dose is not used, it may be stored in the original container at room temperature for 7 days. Get rid of any unused medication after the expiration date. Room Temperature: This medication may be stored at room temperature for up to 7 days. Keep it in the original container. Protect from light until time of use. If it is stored at room temperature, get rid of any unused medication after 7 days or after it expires, whichever is first. To get rid of medications that are no longer needed or have expired: Take the medication to a medication take-back program. Check with your pharmacy or law enforcement to find a location. If you cannot return the medication, ask your pharmacist or care team how to get rid of this medication safely. NOTE: This sheet is a summary. It may not cover all possible information. If you have questions about this medicine, talk to your doctor, pharmacist, or health care provider.  2023 Elsevier/Gold Standard (2021-03-18 00:00:00) Ondansetron Tablets What is this medication? ONDANSETRON (on DAN se tron) prevents nausea and vomiting from chemotherapy, radiation, or surgery. It works by blocking substances in the body that may cause nausea or vomiting. It belongs to a group of medications called antiemetics. This medicine may be used for other purposes; ask your health care provider or pharmacist if you have questions. COMMON BRAND NAME(S): Zofran What should I tell my care team before I take this medication? They  need to know if you have any of these conditions: Heart disease History of irregular heartbeat Liver disease Low levels of magnesium or potassium in the blood An unusual or allergic reaction to ondansetron, granisetron, other medications, foods, dyes, or preservatives Pregnant or trying to get pregnant Breast-feeding How should I use this medication? Take this medication by mouth with a glass of water. Follow the directions on your prescription label. Take your doses at regular intervals. Do not take your medication more often than directed. Talk to your care team regarding the use of this medication in children. Special care may be needed. Overdosage: If you think you have taken too much of this medicine contact a poison control center or emergency room at once. NOTE: This medicine is only for you. Do not share this medicine with others. What if I miss a dose? If you miss a dose, take it as soon as you can. If it is almost time for your next dose, take only that dose. Do not take double or extra doses. What may interact with this medication? Do not take this medication with any of the following: Apomorphine Certain medications for fungal infections like  fluconazole, itraconazole, ketoconazole, posaconazole, voriconazole Cisapride Dronedarone Pimozide Thioridazine This medication may also interact with the following: Carbamazepine Certain medications for depression, anxiety, or psychotic disturbances Fentanyl Linezolid MAOIs like Carbex, Eldepryl, Marplan, Nardil, and Parnate Methylene blue (injected into a vein) Other medications that prolong the QT interval (cause an abnormal heart rhythm) like dofetilide, ziprasidone Phenytoin Rifampicin Tramadol This list may not describe all possible interactions. Give your health care provider a list of all the medicines, herbs, non-prescription drugs, or dietary supplements you use. Also tell them if you smoke, drink alcohol, or use illegal  drugs. Some items may interact with your medicine. What should I watch for while using this medication? Check with your care team right away if you have any sign of an allergic reaction. What side effects may I notice from receiving this medication? Side effects that you should report to your care team as soon as possible: Allergic reactions--skin rash, itching, hives, swelling of the face, lips, tongue, or throat Bowel blockage--stomach cramping, unable to have a bowel movement or pass gas, loss of appetite, vomiting Chest pain (angina)--pain, pressure, or tightness in the chest, neck, back, or arms Heart rhythm changes--fast or irregular heartbeat, dizziness, feeling faint or lightheaded, chest pain, trouble breathing Irritability, confusion, fast or irregular heartbeat, muscle stiffness, twitching muscles, sweating, high fever, seizure, chills, vomiting, diarrhea, which may be signs of serotonin syndrome Side effects that usually do not require medical attention (report to your care team if they continue or are bothersome): Constipation Diarrhea General discomfort and fatigue Headache This list may not describe all possible side effects. Call your doctor for medical advice about side effects. You may report side effects to FDA at 1-800-FDA-1088. Where should I keep my medication? Keep out of the reach of children and pets. Store between 2 and 30 degrees C (36 and 86 degrees F). Throw away any unused medication after the expiration date. NOTE: This sheet is a summary. It may not cover all possible information. If you have questions about this medicine, talk to your doctor, pharmacist, or health care provider.  2023 Elsevier/Gold Standard (2004-02-15 00:00:00) Naratriptan Tablets What is this medication? NARATRIPTAN (NAR a trip tan) treats migraines. It works by blocking pain signals and narrowing blood vessels in the brain. It belongs to a group of medications called triptans. It is not  used to prevent migraines. This medicine may be used for other purposes; ask your health care provider or pharmacist if you have questions. COMMON BRAND NAME(S): Amerge What should I tell my care team before I take this medication? They need to know if you have any of these conditions: Cigarette smoker Circulation problems in fingers and toes Diabetes Heart disease High blood pressure High cholesterol History of irregular heartbeat History of stroke Kidney disease Liver disease Stomach or intestine problems An unusual or allergic reaction to naratriptan, other medications, foods, dyes, or preservatives Pregnant or trying to get pregnant Breast-feeding How should I use this medication? Take this medication by mouth with a glass of water. Follow the directions on the prescription label. Do not take it more often than directed. Talk to your care team regarding the use of this medication in children. Special care may be needed. Overdosage: If you think you have taken too much of this medicine contact a poison control center or emergency room at once. NOTE: This medicine is only for you. Do not share this medicine with others. What if I miss a dose? This does not apply. This medication is  not for regular use. What may interact with this medication? Do not take this medication with any of the following: Ergot alkaloids like dihydroergotamine, ergonovine, ergotamine, methylergonovine Certain medications for migraine headache like almotriptan, eletriptan, frovatriptan, naratriptan, rizatriptan, sumatriptan, zolmitriptan This medication may also interact with the following: Certain medications for depression, anxiety, or psychotic disorders This list may not describe all possible interactions. Give your health care provider a list of all the medicines, herbs, non-prescription drugs, or dietary supplements you use. Also tell them if you smoke, drink alcohol, or use illegal drugs. Some items may  interact with your medicine. What should I watch for while using this medication? Visit your care team for regular checks on your progress. Tell your care team if your symptoms do not start to get better or if they get worse. You may get drowsy or dizzy. Do not drive, use machinery, or do anything that needs mental alertness until you know how this medication affects you. Do not stand up or sit up quickly, especially if you are an older patient. This reduces the risk of dizzy or fainting spells. Alcohol may interfere with the effect of this medication. Tell your care team right away if you have any change in your eyesight. If you take migraine medications for 10 or more days a month, your migraines may get worse. Keep a diary of headache days and medication use. Contact your care team if your migraine attacks occur more frequently. What side effects may I notice from receiving this medication? Side effects that you should report to your care team as soon as possible: Allergic reactions--skin rash, itching, hives, swelling of the face, lips, tongue, or throat Burning, pain, tingling, or color changes in the legs or feet Heart attack--pain or tightness in the chest, shoulders, arms, or jaw, nausea, shortness of breath, cold or clammy skin, feeling faint or lightheaded Heart rhythm changes--fast or irregular heartbeat, dizziness, feeling faint or lightheaded, chest pain, trouble breathing Increase in blood pressure Irritability, confusion, fast or irregular heartbeat, muscle stiffness, twitching muscles, sweating, high fever, seizure, chills, vomiting, diarrhea, which may be signs of serotonin syndrome Raynaud's--cool, numb, or painful fingers or toes that may change color from pale, to blue, to red Seizures Stroke--sudden numbness or weakness of the face, arm, or leg, trouble speaking, confusion, trouble walking, loss of balance or coordination, dizziness, severe headache, change in vision Sudden or  severe stomach pain, nausea, vomiting, fever, or bloody diarrhea Vision loss Side effects that usually do not require medical attention (report to your care team if they continue or are bothersome): Dizziness General discomfort or fatigue This list may not describe all possible side effects. Call your doctor for medical advice about side effects. You may report side effects to FDA at 1-800-FDA-1088. Where should I keep my medication? Keep out of the reach of children and pets. Store at room temperature between 20 and 25 degrees C (68 and 77 degrees F). Throw away any unused medication after the expiration date. NOTE: This sheet is a summary. It may not cover all possible information. If you have questions about this medicine, talk to your doctor, pharmacist, or health care provider.  2023 Elsevier/Gold Standard (2020-03-06 00:00:00) Venlafaxine Tablets What is this medication? VENLAFAXINE (VEN la fax een) treats depression and anxiety. It increases the amount of serotonin and norepinephrine in the brain, hormones that help regulate mood. It belongs to a group of medications called SNRIs. This medicine may be used for other purposes; ask your health care  provider or pharmacist if you have questions. COMMON BRAND NAME(S): Effexor What should I tell my care team before I take this medication? They need to know if you have any of these conditions: Bleeding disorders Glaucoma Heart disease High blood pressure High cholesterol Kidney disease Liver disease Low levels of sodium in the blood Mania or bipolar disorder Seizures Suicidal thoughts, plans, or attempt; a previous suicide attempt by you or a family member Take medications that treat or prevent blood clots Thyroid disease An unusual or allergic reaction to venlafaxine, desvenlafaxine, other medications, foods, dyes, or preservatives Pregnant or trying to get pregnant Breast-feeding How should I use this medication? Take this  medication by mouth with a glass of water. Follow the directions on the prescription label. Take it with food. Take your medication at regular intervals. Do not take your medication more often than directed. Do not stop taking this medication suddenly except upon the advice of your care team. Stopping this medication too quickly may cause serious side effects or your condition may worsen. A special MedGuide will be given to you by the pharmacist with each prescription and refill. Be sure to read this information carefully each time. Talk to your care team regarding the use of this medication in children. Special care may be needed. Overdosage: If you think you have taken too much of this medicine contact a poison control center or emergency room at once. NOTE: This medicine is only for you. Do not share this medicine with others. What if I miss a dose? If you miss a dose, take it as soon as you can. If it is almost time for your next dose, take only that dose. Do not take double or extra doses. What may interact with this medication? Do not take this medication with any of the following: Certain medications for fungal infections like fluconazole, itraconazole, ketoconazole, posaconazole, voriconazole Cisapride Desvenlafaxine Dronedarone Duloxetine Levomilnacipran Linezolid MAOIs like Carbex, Eldepryl, Marplan, Nardil, and Parnate Methylene blue (injected into a vein) Milnacipran Pimozide Thioridazine This medication may also interact with the following: Amphetamines Aspirin and aspirin-like medications Certain medications for depression, anxiety, or psychotic disturbances Certain medications for migraine headaches like almotriptan, eletriptan, frovatriptan, naratriptan, rizatriptan, sumatriptan, zolmitriptan Certain medications for sleep Certain medications that treat or prevent blood clots like dalteparin, enoxaparin,  warfarin Cimetidine Clozapine Diuretics Fentanyl Furazolidone Indinavir Isoniazid Lithium Metoprolol NSAIDS, medications for pain and inflammation, like ibuprofen or naproxen Other medications that prolong the QT interval (cause an abnormal heart rhythm) like dofetilide, ziprasidone Procarbazine Rasagiline Supplements like St. John's wort, kava kava, valerian Tramadol Tryptophan This list may not describe all possible interactions. Give your health care provider a list of all the medicines, herbs, non-prescription drugs, or dietary supplements you use. Also tell them if you smoke, drink alcohol, or use illegal drugs. Some items may interact with your medicine. What should I watch for while using this medication? Tell your care team if your symptoms do not get better or if they get worse. Visit your care team for regular checks on your progress. Because it may take several weeks to see the full effects of this medication, it is important to continue your treatment as prescribed by your care team. Watch for new or worsening thoughts of suicide or depression. This includes sudden changes in mood, behaviors, or thoughts. These changes can happen at any time but are more common in the beginning of treatment or after a change in dose. Call your care team right away if you  experience these thoughts or worsening depression. Manic episodes may happen in patients with bipolar disorder who take this medication. Watch for changes in feelings or behaviors such as feeling anxious, nervous, agitated, panicky, irritable, hostile, aggressive, impulsive, severely restless, overly excited and hyperactive, or trouble sleeping. These changes can happen at any time but are more common in the beginning of treatment or after a change in dose. Call your care team right away if you notice any of these symptoms. This medication can cause an increase in blood pressure. Check with your care team for instructions on  monitoring your blood pressure while taking this medication. You may get drowsy or dizzy. Do not drive, use machinery, or do anything that needs mental alertness until you know how this medication affects you. Do not stand or sit up quickly, especially if you are an older patient. This reduces the risk of dizzy or fainting spells. Alcohol may interfere with the effect of this medication. Avoid alcoholic drinks. Your mouth may get dry. Chewing sugarless gum, sucking hard candy and drinking plenty of water will help. Contact your care team, if the problem does not go away or is severe. What side effects may I notice from receiving this medication? Side effects that you should report to your care team as soon as possible: Allergic reactions--skin rash, itching, hives, swelling of the face, lips, tongue, or throat Bleeding--bloody or black, tar-like stools, red or dark brown urine, vomiting blood or brown material that looks like coffee grounds, small, red or purple spots on skin, unusual bleeding or bruising Heart rhythm changes--fast or irregular heartbeat, dizziness, feeling faint or lightheaded, chest pain, trouble breathing Increase in blood pressure Loss of appetite with weight loss Low sodium level--muscle weakness, fatigue, dizziness, headache, confusion Serotonin syndrome--irritability, confusion, fast or irregular heartbeat, muscle stiffness, twitching muscles, sweating, high fever, seizures, chills, vomiting, diarrhea Sudden eye pain or change in vision such as blurry vision, seeing halos around lights, vision loss Thoughts of suicide or self-harm, worsening mood, feelings of depression Side effects that usually do not require medical attention (report to your care team if they continue or are bothersome): Anxiety, nervousness Change in sex drive or performance Dizziness Dry mouth Excessive sweating Nausea Tremors or shaking Trouble sleeping This list may not describe all possible side  effects. Call your doctor for medical advice about side effects. You may report side effects to FDA at 1-800-FDA-1088. Where should I keep my medication? Keep out of the reach of children and pets. Store at a controlled temperature between 20 and 25 degrees C (68 and 77 degrees F), in a dry place. Throw away any unused medication after the expiration date. NOTE: This sheet is a summary. It may not cover all possible information. If you have questions about this medicine, talk to your doctor, pharmacist, or health care provider.  2023 Elsevier/Gold Standard (2004-02-19 00:00:00)

## 2022-04-30 LAB — CBC WITH DIFFERENTIAL/PLATELET
Basophils Absolute: 0.1 10*3/uL (ref 0.0–0.2)
Basos: 1 %
EOS (ABSOLUTE): 0.1 10*3/uL (ref 0.0–0.4)
Eos: 1 %
Hematocrit: 39.6 % (ref 34.0–46.6)
Hemoglobin: 12.9 g/dL (ref 11.1–15.9)
Immature Grans (Abs): 0 10*3/uL (ref 0.0–0.1)
Immature Granulocytes: 0 %
Lymphocytes Absolute: 1.9 10*3/uL (ref 0.7–3.1)
Lymphs: 21 %
MCH: 29.1 pg (ref 26.6–33.0)
MCHC: 32.6 g/dL (ref 31.5–35.7)
MCV: 89 fL (ref 79–97)
Monocytes Absolute: 0.5 10*3/uL (ref 0.1–0.9)
Monocytes: 6 %
Neutrophils Absolute: 6.5 10*3/uL (ref 1.4–7.0)
Neutrophils: 71 %
Platelets: 291 10*3/uL (ref 150–450)
RBC: 4.44 x10E6/uL (ref 3.77–5.28)
RDW: 12.5 % (ref 11.7–15.4)
WBC: 9.1 10*3/uL (ref 3.4–10.8)

## 2022-04-30 LAB — COMPREHENSIVE METABOLIC PANEL
ALT: 16 IU/L (ref 0–32)
AST: 17 IU/L (ref 0–40)
Albumin/Globulin Ratio: 1.6 (ref 1.2–2.2)
Albumin: 4.5 g/dL (ref 4.0–5.0)
Alkaline Phosphatase: 94 IU/L (ref 44–121)
BUN/Creatinine Ratio: 16 (ref 9–23)
BUN: 12 mg/dL (ref 6–20)
Bilirubin Total: 0.3 mg/dL (ref 0.0–1.2)
CO2: 24 mmol/L (ref 20–29)
Calcium: 9.6 mg/dL (ref 8.7–10.2)
Chloride: 103 mmol/L (ref 96–106)
Creatinine, Ser: 0.75 mg/dL (ref 0.57–1.00)
Globulin, Total: 2.9 g/dL (ref 1.5–4.5)
Glucose: 94 mg/dL (ref 70–99)
Potassium: 4.3 mmol/L (ref 3.5–5.2)
Sodium: 140 mmol/L (ref 134–144)
Total Protein: 7.4 g/dL (ref 6.0–8.5)
eGFR: 113 mL/min/{1.73_m2} (ref >59)

## 2022-04-30 LAB — TSH RFX ON ABNORMAL TO FREE T4: TSH: 1.29 u[IU]/mL (ref 0.450–4.500)

## 2022-05-04 ENCOUNTER — Other Ambulatory Visit (HOSPITAL_COMMUNITY): Payer: Self-pay

## 2022-05-04 ENCOUNTER — Encounter: Payer: Self-pay | Admitting: Neurology

## 2022-05-04 DIAGNOSIS — F32A Depression, unspecified: Secondary | ICD-10-CM | POA: Insufficient documentation

## 2022-05-04 DIAGNOSIS — F419 Anxiety disorder, unspecified: Secondary | ICD-10-CM | POA: Insufficient documentation

## 2022-05-04 DIAGNOSIS — R5383 Other fatigue: Secondary | ICD-10-CM | POA: Insufficient documentation

## 2022-05-04 DIAGNOSIS — G43009 Migraine without aura, not intractable, without status migrainosus: Secondary | ICD-10-CM | POA: Insufficient documentation

## 2022-05-04 DIAGNOSIS — G43709 Chronic migraine without aura, not intractable, without status migrainosus: Secondary | ICD-10-CM | POA: Insufficient documentation

## 2022-05-05 ENCOUNTER — Telehealth: Payer: Self-pay | Admitting: Neurology

## 2022-05-05 NOTE — Telephone Encounter (Signed)
Referral sent to Noemi Chapel, phone # [phone].

## 2022-05-11 ENCOUNTER — Telehealth: Payer: Self-pay | Admitting: *Deleted

## 2022-05-11 DIAGNOSIS — F419 Anxiety disorder, unspecified: Secondary | ICD-10-CM | POA: Diagnosis not present

## 2022-05-11 DIAGNOSIS — G43109 Migraine with aura, not intractable, without status migrainosus: Secondary | ICD-10-CM | POA: Diagnosis not present

## 2022-05-11 DIAGNOSIS — F331 Major depressive disorder, recurrent, moderate: Secondary | ICD-10-CM | POA: Diagnosis not present

## 2022-05-11 NOTE — Telephone Encounter (Signed)
Received notice from [facility] that Ajovy 225 mg PA is needed.

## 2022-05-14 ENCOUNTER — Other Ambulatory Visit (HOSPITAL_COMMUNITY): Payer: Self-pay

## 2022-05-14 NOTE — Telephone Encounter (Signed)
I called the patient and notified them of the approval.

## 2022-05-14 NOTE — Telephone Encounter (Signed)
Pharmacy Patient Advocate Encounter  Prior Authorization for AJOVY (fremanezumab-vfrm) injection 225MG /1.5ML auto-injectors has been approved by [facility] (ins).    PA # PA Case ID: [id number] Effective dates: 04/14/2022 through 05/14/2023

## 2022-05-14 NOTE — Telephone Encounter (Signed)
I called the patient to let her know that her Ajovy was approved for a year.  She stated that even though she is used to having headaches every day, they have been more moderate to severe since she was seen and restarted the Ajovy on 04/29/22.  She states she had ran out of zonisamide a month before her appointment.  She is not sure if this is just her body try to get used to the medication.  I advised that Ajovy should not cause worsened headaches.  It can take 2 to 3 months to take good effect.  Patient states she has tried naratriptan, Ubrelvy, ibuprofen.  She does not feel the headache is bad enough to go to an urgent care.  She states she only goes to the ER when she is worried about dehydration.  I let her know I would send a message to Dr. Jaynee Eagles and we will give her a call back and it may be Monday due to the office being closed tomorrow. Otherwise I have recommended that she continue Ajovy when her next refill is due.

## 2022-05-18 NOTE — Telephone Encounter (Signed)
I called pt to follow up on her message and Dr. Trevor Mace recommendation.  I relayed that ajovy needs more time to work.  She gets it every 30 days. She would hold off an changing anything right now and see how she does.   She appreciated call back.

## 2022-05-18 NOTE — Telephone Encounter (Signed)
Just started the ajovy on 3/20 correct?needs more time to work. She can have a refill of zonisamide back if she wants/

## 2022-07-22 ENCOUNTER — Ambulatory Visit: Payer: BC Managed Care – PPO | Admitting: Neurology

## 2022-07-22 ENCOUNTER — Encounter: Payer: Self-pay | Admitting: Neurology

## 2022-07-22 VITALS — BP 136/84 | HR 80 | Ht 65.0 in | Wt 193.0 lb

## 2022-07-22 DIAGNOSIS — G43009 Migraine without aura, not intractable, without status migrainosus: Secondary | ICD-10-CM | POA: Diagnosis not present

## 2022-07-22 MED ORDER — UBRELVY 100 MG PO TABS: 100.0000 mg | ORAL_TABLET | ORAL | 11 refills | Status: DC | PRN

## 2022-07-22 MED ORDER — ZAVZPRET 10 MG/ACT NA SOLN: 1.0000 | Freq: Every day | NASAL | 0 refills | Status: DC | PRN

## 2022-07-22 NOTE — Patient Instructions (Addendum)
Autumn Vaughn starts to work after approximately one and a half to two hours, with its maximum effect being between 3 to 8 hours.   Zavegepant Nasal Spray What is this medication? ZAVEGEPANT (za VE je pant) treats migraines. It works by blocking a substance in the body that causes migraines. It is not used to prevent migraines. This medicine may be used for other purposes; ask your health care provider or pharmacist if you have questions. COMMON BRAND NAME(S): ZAVZPRET What should I tell my care team before I take this medication? They need to know if you have any of these conditions: Kidney disease Liver disease An unusual or allergic reaction to zavegepant, other medications, foods, dyes, or preservatives Pregnant or trying to get pregnant Breast-feeding How should I use this medication? This medication is for use in the nose. Take it as directed on the prescription label. Do not use it more often than directed. Make sure that you are using your nasal spray correctly. Ask your care team if you have any questions. Talk to your care team about the use of this medication in children. Special care may be needed. Overdosage: If you think you have taken too much of this medicine contact a poison control center or emergency room at once. NOTE: This medicine is only for you. Do not share this medicine with others. What if I miss a dose? This does not apply. This medication is not for regular use. It should only be used as needed. What may interact with this medication? Decongestant nasal sprays This medication may affect how other medications work, and other medications may affect the way this medication works. Talk with your care team about all of the medications you take. They may suggest changes to your treatment plan to lower the risk of side effects and to make sure your medications work as intended. This list may not describe all possible interactions. Give your health care provider a list of all  the medicines, herbs, non-prescription drugs, or dietary supplements you use. Also tell them if you smoke, drink alcohol, or use illegal drugs. Some items may interact with your medicine. What should I watch for while using this medication? Visit your care team for regular checks on your progress. Tell your care team if your symptoms do not start to get better or if they get worse. What side effects may I notice from receiving this medication? Side effects that you should report to your care team as soon as possible: Allergic reactions--skin rash, itching, hives, swelling of the face, lips, tongue, or throat Side effects that usually do not require medical attention (report to your care team if they continue or are bothersome): Change in taste Dryness or irritation inside the nose Nausea Vomiting This list may not describe all possible side effects. Call your doctor for medical advice about side effects. You may report side effects to FDA at 1-800-FDA-1088. Where should I keep my medication? Keep out of the reach of children and pets. Store at room temperature between 20 and 25 degrees C (68 and 77 degrees F). Do not freeze. Get rid of any unused medication after the expiration date. To get rid of medications that are no longer needed or have expired: Take the medication to a medication take-back program. Check with your pharmacy or law enforcement to find a location. If you cannot return the medication, ask your pharmacist or care team how to get rid of this medication safely. NOTE: This sheet is a summary. It may  not cover all possible information. If you have questions about this medicine, talk to your doctor, pharmacist, or health care provider.  Atogepant Tablets What is this medication? ATOGEPANT (a TOE je pant) prevents migraines. It works by blocking a substance in the body that causes migraines. This medicine may be used for other purposes; ask your health care provider or pharmacist  if you have questions. COMMON BRAND NAME(S): QULIPTA What should I tell my care team before I take this medication? They need to know if you have any of these conditions: Kidney disease Liver disease An unusual or allergic reaction to atogepant, other medications, foods, dyes, or preservatives Pregnant or trying to get pregnant Breast-feeding How should I use this medication? Take this medication by mouth with water. Take it as directed on the prescription label at the same time every day. You can take it with or without food. If it upsets your stomach, take it with food. Keep taking it unless your care team tells you to stop. Talk to your care team about the use of this medication in children. Special care may be needed. Overdosage: If you think you have taken too much of this medicine contact a poison control center or emergency room at once. NOTE: This medicine is only for you. Do not share this medicine with others. What if I miss a dose? If you miss a dose, take it as soon as you can. If it is almost time for your next dose, take only that dose. Do not take double or extra doses. What may interact with this medication? Carbamazepine Certain medications for fungal infections, such as itraconazole, ketoconazole Clarithromycin Cyclosporine Efavirenz Etravirine Phenytoin Rifampin St. John's wort This list may not describe all possible interactions. Give your health care provider a list of all the medicines, herbs, non-prescription drugs, or dietary supplements you use. Also tell them if you smoke, drink alcohol, or use illegal drugs. Some items may interact with your medicine. What should I watch for while using this medication? Visit your care team for regular checks on your progress. Tell your care team if your symptoms do not start to get better or if they get worse. What side effects may I notice from receiving this medication? Side effects that you should report to your care team  as soon as possible: Allergic reactions--skin rash, itching, hives, swelling of the face, lips, tongue, or throat Side effects that usually do not require medical attention (report to your care team if they continue or are bothersome): Constipation Fatigue Loss of appetite with weight loss Nausea This list may not describe all possible side effects. Call your doctor for medical advice about side effects. You may report side effects to FDA at 1-800-FDA-1088. Where should I keep my medication? Keep out of the reach of children and pets. Store at room temperature between 20 and 25 degrees C (68 and 77 degrees F). Get rid of any unused medication after the expiration date. To get rid of medications that are no longer needed or have expired: Take the medication to a medication take-back program. Check with your pharmacy or law enforcement to find a location. If you cannot return the medication, check the label or package insert to see if the medication should be thrown out in the garbage or flushed down the toilet. If you are not sure, ask your care team. If it is safe to put it in the trash, take the medication out of the container. Mix the medication with cat litter,  dirt, coffee grounds, or other unwanted substance. Seal the mixture in a bag or container. Put it in the trash. NOTE: This sheet is a summary. It may not cover all possible information. If you have questions about this medicine, talk to your doctor, pharmacist, or health care provider.  2024 [employer]/Gold Standard (2021-03-17 00:00:00) Galcanezumab Injection What is this medication? GALCANEZUMAB (gal ka NEZ ue mab) prevents migraines. It works by blocking a substance in the body that causes migraines. It may also be used to treat cluster headaches. It is a monoclonal antibody. This medicine may be used for other purposes; ask your health care provider or pharmacist if you have questions. COMMON BRAND NAME(S): Emgality What should I  tell my care team before I take this medication? They need to know if you have any of these conditions: An unusual or allergic reaction to galcanezumab, other medications, foods, dyes, or preservatives Pregnant or trying to get pregnant Breast-feeding How should I use this medication? This medication is injected under the skin. You will be taught how to prepare and give it. Take it as directed on the prescription label. Keep taking it unless your care team tells you to stop. It is important that you put your used needles and syringes in a special sharps container. Do not put them in a trash can. If you do not have a sharps container, call your pharmacist or care team to get one. Talk to your care team about the use of this medication in children. Special care may be needed. Overdosage: If you think you have taken too much of this medicine contact a poison control center or emergency room at once. NOTE: This medicine is only for you. Do not share this medicine with others. What if I miss a dose? If you miss a dose, take it as soon as you can. If it is almost time for your next dose, take only that dose. Do not take double or extra doses. What may interact with this medication? Interactions are not expected. This list may not describe all possible interactions. Give your health care provider a list of all the medicines, herbs, non-prescription drugs, or dietary supplements you use. Also tell them if you smoke, drink alcohol, or use illegal drugs. Some items may interact with your medicine. What should I watch for while using this medication? Visit your care team for regular checks on your progress. Tell your care team if your symptoms do not start to get better or if they get worse. What side effects may I notice from receiving this medication? Side effects that you should report to your care team as soon as possible: Allergic reactions or angioedema--skin rash, itching or hives, swelling of the  face, eyes, lips, tongue, arms, or legs, trouble swallowing or breathing Side effects that usually do not require medical attention (report to your care team if they continue or are bothersome): Pain, redness, or irritation at injection site This list may not describe all possible side effects. Call your doctor for medical advice about side effects. You may report side effects to FDA at 1-800-FDA-1088. Where should I keep my medication? Keep out of the reach of children and pets. Store in a refrigerator or at room temperature between 20 and 25 degrees C (68 and 77 degrees F). Refrigeration (preferred): Store in the refrigerator. Do not freeze. Keep in the original container until you are ready to take it. Remove the dose from the carton about 30 minutes before it is time for  you to use it. If the dose is not used, it may be stored in original container at room temperature for 7 days. Get rid of any unused medication after the expiration date. Room Temperature: This medication may be stored at room temperature for up to 7 days. Keep it in the original container. Protect from light until time of use. If it is stored at room temperature, get rid of any unused medication after 7 days or after it expires, whichever is first. To get rid of medications that are no longer needed or have expired: Take the medication to a medication take-back program. Check with your pharmacy or law enforcement to find a location. If you cannot return the medication, ask your pharmacist or care team how to get rid of this medication safely. NOTE: This sheet is a summary. It may not cover all possible information. If you have questions about this medicine, talk to your doctor, pharmacist, or health care provider.  2024 [employer]/Gold Standard (2021-03-24 00:00:00)   2024 [employer] Standard (2021-04-24 00:00:00)

## 2022-07-22 NOTE — Progress Notes (Signed)
[facility]    Provider:  Dr Lucia Gaskins Requesting Provider: Laurann Montana, MD Primary Care Provider:  Laurann Montana, MD  CC:  migraines  07/22/2022: She has 6 Ajovy so told her to take those, in date, she only has 5 migraine days a month, still incredible improvements from > 15 migraines days a month at baseline. Now 5 migraines a month and < 10 total headache days a month. Gave zavzpret samples. She will let me know.  She is going to [employer] me and let me know how the ajovy and zavzpret does. If Ajovy doesn't work, try [employer] or [employer]. Bernita Raisin starts to work after approximately one and a half to two hours, with its maximum effect being between 3 to 8 hours., discussed if you know you have a trigger can take in advance 2-3 hours prior. Try Zavzpret and let us know.   Patient complains of symptoms per HPI as well as the following symptoms: none . Pertinent negatives and positives per HPI. All others negative    HPI 04/29/2022:  Corrine Vaughn is a 27 y.o. female here as requested by Laurann Montana, MD for migraines. PMHx migraines, panic attacks, chronic headaches, depression, tension headache, anxiety, migraine, stress, migraine aura, allergy. She has always had headaches, mood/irritability ismigraine prodrom since she was a child, in high scholl concussion made migraines worse (already had them as a child), mother with migraines. Lights are a trigger, looking into lights can trigger visual changes she describes as aura, she has a lot of nausea, vomiting with the migraines, pulsating/pounding/throbbing, photo/phonophobia, hurts to move, last 24-72 hours and be moderate to severe she had to been to the ED for dehydration due to vomiting so much, ongoing for years, daily headaches at least > 15 migraines that can be moderate to severe and last 24-72 hours. No new weakness, no new vision changes, not positional or exertional, no changes in quality or severity. No red flags, no  indication for imaging but low threshold discussed. Ubrelvy helped. Nurtec did not. No other focal neurologic deficits, associated symptoms, inciting events or modifiable factors. Ajovy worked great we can try again.   She is here alone, lovely, needs a psychiatrist for depression and anxiety will refer to lisa poulos.   Reviewed notes, labs and imaging from outside physicians, which showed:  Reviewed [facility]'s records from 2022 as follows:  Current and past medications: ANALGESICS: Tylenol ANTI-MIGRAINE: Maxalt, Imitrex, Nurtec been ineffective, Ubrelvy helped HEART/BP: blood presssure meds contraindicated due to hypotension DECONGESTANT/ANTIHISTAMINE: Allegra ANTI-NAUSEANT: Zofran NSAIDS: Aleve, naproxen MUSCLE RELAXANTS: Flexeril, baclofen ANTI-CONVULSANTS: Topamax, Zonegran STEROIDS: SLEEPING PILLS/TRANQUILIZERS: melatonin ANTI-DEPRESSANTS: Effexor, Prozac, amitriptyline, lexapro,  HERBAL: Magnesium, riboflavin FIBROMYALGIA:  HORMONAL: OTHER: Ajovy, aimovig contraindicated due to constipation PROCEDURES FOR HEADACHES:   MRI brain 01/10/2013: EXAM:  MRI HEAD WITHOUT CONTRAST   TECHNIQUE:  Multiplanar, multiecho pulse sequences of the brain and surrounding  structures were obtained without intravenous contrast.   COMPARISON:  None.   FINDINGS:  Ventricle size is normal. Negative for Chiari malformation.  Craniocervical junction is normal. Pituitary is normal in size.   Negative for acute or chronic infarction. Negative for demyelinating  disease. Negative for hemorrhage or mass lesion. No fluid collection  or midline shift.   Paranasal sinuses are clear.   IMPRESSION:  Normal      Latest Ref Rng & Units 04/29/2022    2:45 PM 12/02/2020    5:15 AM 03/29/2018   10:28 PM  CBC  WBC 3.4 - 10.8 x10E3/uL 9.1  12.0  8.4   Hemoglobin 11.1 - 15.9 g/dL 40.9  81.1  91.4   Hematocrit 34.0 - 46.6 % 39.6  39.4  41.0   Platelets 150 - 450 x10E3/uL 291  275  232       Latest  Ref Rng & Units 04/29/2022    2:45 PM 12/02/2020    5:15 AM 03/29/2018   10:28 PM  CMP  Glucose 70 - 99 mg/dL 94  782  90   BUN 6 - 20 mg/dL 12  13  11    Creatinine 0.57 - 1.00 mg/dL 9.56  2.13  0.86   Sodium 134 - 144 mmol/L 140  138  134   Potassium 3.5 - 5.2 mmol/L 4.3  3.4  3.6   Chloride 96 - 106 mmol/L 103  107  101   CO2 20 - 29 mmol/L 24  19  24    Calcium 8.7 - 10.2 mg/dL 9.6  9.7  9.8   Total Protein 6.0 - 8.5 g/dL 7.4  7.4  8.0   Total Bilirubin 0.0 - 1.2 mg/dL 0.3  0.3  0.7   Alkaline Phos 44 - 121 IU/L 94  67  66   AST 0 - 40 IU/L 17  19  16    ALT 0 - 32 IU/L 16  15  17        Review of Systems: Patient complains of symptoms per HPI as well as the following symptoms migraines. Pertinent negatives and positives per HPI. All others negative.   Social History   Socioeconomic History   Marital status: Single    Spouse name: Not on file   Number of children: Not on file   Years of education: Not on file   Highest education level: Not on file  Occupational History   Not on file  Tobacco Use   Smoking status: Never   Smokeless tobacco: Never  Vaping Use   Vaping Use: Never used  Substance and Sexual Activity   Alcohol use: No   Drug use: No   Sexual activity: Not on file  Other Topics Concern   Not on file  Social History Narrative   Lives at home with family   Right handed   Caffeine: up to 4 soda cans per day lately   Social Determinants of Health   Financial Resource Strain: Not on file  Food Insecurity: Not on file  Transportation Needs: Not on file  Physical Activity: Not on file  Stress: Not on file  Social Connections: Not on file  Intimate Partner Violence: Not on file    Family History  Problem Relation Age of Onset   Migraines Mother    Heart Problems Maternal Grandmother    Migraines Cousin     Past Medical History:  Diagnosis Date   Migraine     Patient Active Problem List   Diagnosis Date Noted   Chronic migraine without aura  without status migrainosus, not intractable 05/04/2022   Depression 05/04/2022   Anxiety 05/04/2022   Other fatigue 05/04/2022    Past Surgical History:  Procedure Laterality Date   NO PAST SURGERIES      Current Outpatient Medications  Medication Sig Dispense Refill   Fremanezumab-vfrm (AJOVY) 225 MG/1.5ML SOAJ Inject 225 mg into the skin every 30 (thirty) days. BIN#: [id number] PCN#: [id number] GROUP#: [id number] ID#: [id number] EXPIRES: 02/09/2023 1.5 mL 11   IBUPROFEN PO Take by mouth.     MAGNESIUM PO Take by mouth.     naratriptan (AMERGE)  2.5 MG tablet Take 1 tablet (2.5 mg total) by mouth as needed for migraine. Take one (1) tablet at onset of headache; if returns or does not resolve, may repeat after 2 hours; do not exceed five (5) mg in 24 hours. 9 tablet 11   ondansetron (ZOFRAN) 4 MG tablet Take 1 tablet (4 mg total) by mouth every 6 (six) hours. 20 tablet 11   RIBOFLAVIN PO Take by mouth.     venlafaxine XR (EFFEXOR-XR) 75 MG 24 hr capsule Take one daily 90 capsule 3   Zavegepant HCl (ZAVZPRET) 10 MG/ACT SOLN Place 1 spray into the nose daily as needed. 2 each 0   UBRELVY 100 MG TABS Take 1 tablet (100 mg total) by mouth every 2 (two) hours as needed. Max 2x a day 16 tablet 11   No current facility-administered medications for this visit.    Allergies as of 07/22/2022 - Review Complete 07/22/2022  Allergen Reaction Noted   Topiramate Other (See Comments) 01/19/2019    Vitals: BP 136/84 (BP Location: Right Arm, Patient Position: Sitting)   Pulse 80   Ht 5\' 5"  (1.651 m)   Wt 193 lb (87.5 kg)   LMP 07/09/2022 (Exact Date)   BMI 32.12 kg/m  Last Weight:  Wt Readings from Last 1 Encounters:  07/22/22 193 lb (87.5 kg)   Last Height:   Ht Readings from Last 1 Encounters:  07/22/22 5\' 5"  (1.651 m)    Physical exam: Exam: Gen: NAD, conversant, well nourised, obese, well groomed                     CV: RRR, no MRG. No Carotid Bruits. No peripheral edema, warm,  nontender Eyes: Conjunctivae clear without exudates or hemorrhage  Neuro: Detailed Neurologic Exam  Speech:    Speech is normal; fluent and spontaneous with normal comprehension.  Cognition:    The patient is oriented to person, place, and time;     recent and remote memory intact;     language fluent;     normal attention, concentration,     fund of knowledge Cranial Nerves:    The pupils are equal, round, and reactive to light. The fundi are normal and spontaneous venous pulsations are present. Visual fields are full to finger confrontation. Extraocular movements are intact. Trigeminal sensation is intact and the muscles of mastication are normal. The face is symmetric. The palate elevates in the midline. Hearing intact. Voice is normal. Shoulder shrug is normal. The tongue has normal motion without fasciculations.   Coordination:    Normal finger to nose and heel to shin. Normal rapid alternating movements.   Gait:    Heel-toe and tandem gait are normal.   Motor Observation:    No asymmetry, no atrophy, and no involuntary movements noted. Tone:    Normal muscle tone.    Posture:    Posture is normal. normal erect    Strength:    Strength is V/V in the upper and lower limbs.      Sensation: intact to LT     Reflex Exam:  DTR's:    Deep tendon reflexes in the upper and lower extremities are normal bilaterally.   Toes:    The toes are downgoing bilaterally.   Clonus:    Clonus is absent.      Assessment/Plan:  1. Migraine without aura and without status migrainosus  She is here alone, lovely, needs a psychiatrist for depression and anxiety will refer to lisa  poulos. Playing phone tag  Ajovy: Come off of it 6 months prior to pregnancy, discussed teratogenicity. She had a migraine today. Tried zavzpret she will call if she wants prescription No red flags, no indication for imaging but low threshold discussed. MRI of the brain in the past was normal per patient.    Continue Ajovy: Come off of it 6 months prior to pregnancy, discussed teratogenicity - once monthly prevention Stopped zonisamide Continue ondansetron and ubrelvy Not taking baclofen anymore, can see next time if needed Try naratriptan acutely, then when better we can go to [place] as needed Continue Venlafaxine Refer to Ellis Savage in psychiatry - playing phone tag  She only has 5 migraine days a month, still incredible improvements from > 15 migraines days a month at baseline. Now 5 migraines a month and < 10 total headache days a month. Gave zavzpret samples. She will let me know.  She is going to [employer] me and let me know how the ajovy and zavzpret does. If Ajovy doesn't work, try [employer] or [employer]. Bernita Raisin starts to work after approximately one and a half to two hours, with its maximum effect being between 3 to 8 hours., discussed if you know you have a trigger can take in advance 2-3 hours prior. Try Zavzpret and let us know.   Discussed: "There is increased risk for stroke in women with migraine with aura and a contraindication for the combined contraceptive pill for use by women who have migraine with aura. The risk for women with migraine without aura is lower. However other risk factors like smoking are far more likely to increase stroke risk than migraine. There is a recommendation for no smoking and for the use of OCPs without estrogen such as progestogen only pills particularly for women with migraine with aura.Marland Kitchen People who have migraine headaches with auras may be 3 times more likely to have a stroke caused by a blood clot, compared to migraine patients who don't see auras. Women who take hormone-replacement therapy may be 30 percent more likely to suffer a clot-based stroke than women not taking medication containing estrogen. Other risk factors like smoking and high blood pressure may be  much more important.  1 year f/u   Meds ordered this encounter  Medications   UBRELVY 100  MG TABS    Sig: Take 1 tablet (100 mg total) by mouth every 2 (two) hours as needed. Max 2x a day    Dispense:  16 tablet    Refill:  11   Zavegepant HCl (ZAVZPRET) 10 MG/ACT SOLN    Sig: Place 1 spray into the nose daily as needed.    Dispense:  2 each    Refill:  0    [id number] 2025-07    Cc: Laurann Montana, MD,  Laurann Montana, MD  Naomie Dean, MD  [facility] [address] [place], [place] [address]  Phone [phone] Fax [phone]  I spent over 30 minutes of face-to-face and non-face-to-face time with patient on the  1. Migraine without aura and without status migrainosus, not intractable     diagnosis.  This included previsit chart review, lab review, study review, order entry, electronic health record documentation, patient education on the different diagnostic and therapeutic options, counseling and coordination of care, risks and benefits of management, compliance, or risk factor reduction

## 2022-11-11 DIAGNOSIS — Z0000 Encounter for general adult medical examination without abnormal findings: Secondary | ICD-10-CM | POA: Diagnosis not present

## 2022-11-11 DIAGNOSIS — Z23 Encounter for immunization: Secondary | ICD-10-CM | POA: Diagnosis not present

## 2023-02-04 DIAGNOSIS — Z20822 Contact with and (suspected) exposure to covid-19: Secondary | ICD-10-CM | POA: Diagnosis not present

## 2023-02-04 DIAGNOSIS — J029 Acute pharyngitis, unspecified: Secondary | ICD-10-CM | POA: Diagnosis not present

## 2023-02-04 DIAGNOSIS — R059 Cough, unspecified: Secondary | ICD-10-CM | POA: Diagnosis not present

## 2023-07-22 ENCOUNTER — Ambulatory Visit: Payer: BC Managed Care – PPO | Admitting: Neurology

## 2023-07-22 ENCOUNTER — Telehealth: Payer: Self-pay | Admitting: *Deleted

## 2023-07-22 ENCOUNTER — Encounter: Payer: Self-pay | Admitting: Neurology

## 2023-07-22 VITALS — BP 114/75 | HR 77 | Ht 65.0 in | Wt 192.0 lb

## 2023-07-22 DIAGNOSIS — G43009 Migraine without aura, not intractable, without status migrainosus: Secondary | ICD-10-CM | POA: Diagnosis not present

## 2023-07-22 MED ORDER — ZONISAMIDE 50 MG PO CAPS: 50.0000 mg | ORAL_CAPSULE | Freq: Every day | ORAL | 11 refills | Status: AC

## 2023-07-22 MED ORDER — ZONISAMIDE 100 MG PO CAPS: 100.0000 mg | ORAL_CAPSULE | Freq: Every day | ORAL | 11 refills | Status: DC

## 2023-07-22 MED ORDER — UBRELVY 100 MG PO TABS: 100.0000 mg | ORAL_TABLET | ORAL | 11 refills | Status: DC | PRN

## 2023-07-22 MED ORDER — UBRELVY 100 MG PO TABS: 100.0000 mg | ORAL_TABLET | ORAL | 11 refills | Status: AC | PRN

## 2023-07-22 MED ORDER — ZONISAMIDE 100 MG PO CAPS: 100.0000 mg | ORAL_CAPSULE | Freq: Every day | ORAL | 11 refills | Status: AC

## 2023-07-22 MED ORDER — ZAVZPRET 10 MG/ACT NA SOLN: 1.0000 | Freq: Every day | NASAL | Status: AC | PRN

## 2023-07-22 MED ORDER — QULIPTA 60 MG PO TABS: 60.0000 mg | ORAL_TABLET | Freq: Every day | ORAL | 11 refills | Status: AC

## 2023-07-22 MED ORDER — QULIPTA 60 MG PO TABS: 60.0000 mg | ORAL_TABLET | Freq: Every day | ORAL | Status: DC

## 2023-07-22 NOTE — Telephone Encounter (Signed)
 Faxed completed application for Ubrelvy  & Qulipta assistance to the [employer]. Received a receipt of confirmation.

## 2023-07-22 NOTE — Progress Notes (Signed)
 [id number]    Provider:  Dr Tresia Fruit Requesting Provider: Victorio Grave, MD Primary Care Provider:  Victorio Grave, MD  CC:  migraines  07/22/2023:  since turning 28 lost her insurance. She was on Ajovy  which possibly wasn't working as well as it had but also she is not good at managing once monthly injections would be better with compliance if once daily pill. Last Ajovy  was in April. She has been out of work with migraines, mod to severe,  for a week last month, the ajovy  was not working as well. She works at [employer]. Could try to change from Ajovy  to [place] daily pill (as above forgets the monthly injection would be better on a pill) can could see if we could get the qulipta through [facility](multiple triptans and nurtec ineffective, only zavzpret  and ubrelvy  effective)  . She uses condoms she is not trying to get pregnant, discussed teratogenicty.  She has 6-7 migraine days a month and , <10 total headache days a month. She lost her insurance. Calling the [facility] patient access support and filling out paperwork with her today.   Patient complains of symptoms per HPI as well as the following symptoms: none . Pertinent negatives and positives per HPI. All others negative   07/22/2022: She has 6 Ajovy  so told her to take those, in date, she only has 5 migraine days a month, still incredible improvements from > 15 migraines days a month at baseline. Now 5 migraines a month and < 10 total headache days a month. Gave zavzpret  samples. She will let me know.  She is going to [employer] me and let me know how the ajovy  and zavzpret  does. If Ajovy  doesn't work, try [employer] or [place]. Ubrelvy  starts to work after approximately one and a half to two hours, with its maximum effect being between 3 to 8 hours., discussed if you know you have a trigger can take in advance 2-3 hours prior. Try Zavzpret  and let us  know.   Patient complains of symptoms per HPI as well as the following symptoms:  none . Pertinent negatives and positives per HPI. All others negative    HPI 04/29/2022:  Autumn Vaughn is a 28 y.o. female here as requested by Victorio Grave, MD for migraines. PMHx migraines, panic attacks, chronic headaches, depression, tension headache, anxiety, migraine, stress, migraine aura, allergy. She has always had headaches, mood/irritability ismigraine prodrom since she was a child, in high scholl concussion made migraines worse (already had them as a child), mother with migraines. Lights are a trigger, looking into lights can trigger visual changes she describes as aura, she has a lot of nausea, vomiting with the migraines, pulsating/pounding/throbbing, photo/phonophobia, hurts to move, last 24-72 hours and be moderate to severe she had to been to the ED for dehydration due to vomiting so much, ongoing for years, daily headaches at least > 15 migraines that can be moderate to severe and last 24-72 hours. No new weakness, no new vision changes, not positional or exertional, no changes in quality or severity. No red flags, no indication for imaging but low threshold discussed. Ubrelvy  helped. Nurtec did not. No other focal neurologic deficits, associated symptoms, inciting events or modifiable factors. Ajovy  worked great we can try again.   She is here alone, lovely, needs a psychiatrist for depression and anxiety will refer to lisa poulos.   Reviewed notes, labs and imaging from outside physicians, which showed:  Reviewed [facility]'s records from 2022 as follows:  Current and past medications:  ANALGESICS: Tylenol ANTI-MIGRAINE: Maxalt, Imitrex, Nurtec been ineffective,naratriptan , Ubrelvy  helped HEART/BP: blood presssure meds contraindicated due to hypotension DECONGESTANT/ANTIHISTAMINE: Allegra ANTI-NAUSEANT: Zofran  NSAIDS: Aleve , naproxen  MUSCLE RELAXANTS: Flexeril, baclofen ANTI-CONVULSANTS: Topamax, Zonegran STEROIDS: SLEEPING PILLS/TRANQUILIZERS: melatonin ANTI-DEPRESSANTS:  Effexor , Prozac, amitriptyline, lexapro,  HERBAL: Magnesium, riboflavin FIBROMYALGIA:  HORMONAL: OTHER: Ajovy , aimovig contraindicated due to constipation PROCEDURES FOR HEADACHES:   MRI brain 01/10/2013: EXAM:  MRI HEAD WITHOUT CONTRAST   TECHNIQUE:  Multiplanar, multiecho pulse sequences of the brain and surrounding  structures were obtained without intravenous contrast.   COMPARISON:  None.   FINDINGS:  Ventricle size is normal. Negative for Chiari malformation.  Craniocervical junction is normal. Pituitary is normal in size.   Negative for acute or chronic infarction. Negative for demyelinating  disease. Negative for hemorrhage or mass lesion. No fluid collection  or midline shift.   Paranasal sinuses are clear.   IMPRESSION:  Normal      Latest Ref Rng & Units 04/29/2022    2:45 PM 12/02/2020    5:15 AM 03/29/2018   10:28 PM  CBC  WBC 3.4 - 10.8 x10E3/uL 9.1  12.0  8.4   Hemoglobin 11.1 - 15.9 g/dL 95.2  84.1  32.4   Hematocrit 34.0 - 46.6 % 39.6  39.4  41.0   Platelets 150 - 450 x10E3/uL 291  275  232       Latest Ref Rng & Units 04/29/2022    2:45 PM 12/02/2020    5:15 AM 03/29/2018   10:28 PM  CMP  Glucose 70 - 99 mg/dL 94  401  90   BUN 6 - 20 mg/dL 12  13  11    Creatinine 0.57 - 1.00 mg/dL 0.27  2.53  6.64   Sodium 134 - 144 mmol/L 140  138  134   Potassium 3.5 - 5.2 mmol/L 4.3  3.4  3.6   Chloride 96 - 106 mmol/L 103  107  101   CO2 20 - 29 mmol/L 24  19  24    Calcium 8.7 - 10.2 mg/dL 9.6  9.7  9.8   Total Protein 6.0 - 8.5 g/dL 7.4  7.4  8.0   Total Bilirubin 0.0 - 1.2 mg/dL 0.3  0.3  0.7   Alkaline Phos 44 - 121 IU/L 94  67  66   AST 0 - 40 IU/L 17  19  16    ALT 0 - 32 IU/L 16  15  17        Review of Systems: Patient complains of symptoms per HPI as well as the following symptoms migraines. Pertinent negatives and positives per HPI. All others negative.   Social History   Socioeconomic History   Marital status: Single    Spouse name: Not  on file   Number of children: Not on file   Years of education: Not on file   Highest education level: Not on file  Occupational History   Not on file  Tobacco Use   Smoking status: Never   Smokeless tobacco: Never  Vaping Use   Vaping status: Never Used  Substance and Sexual Activity   Alcohol use: No   Drug use: No   Sexual activity: Not on file  Other Topics Concern   Not on file  Social History Narrative   Lives at home with family   Right handed   Caffeine: up to 4 soda cans per day lately   Social Drivers of Corporate investment banker Strain: Not on file  Food Insecurity: No  Food Insecurity (02/13/2020)   Received from [facility]   Hunger Vital Sign    Within the past 12 months, you worried that your food would run out before you got the money to buy more.: Never true    Within the past 12 months, the food you bought just didn't last and you didn't have money to get more.: Never true  Transportation Needs: Not on file  Physical Activity: Not on file  Stress: Not on file  Social Connections: Unknown (06/22/2021)   Received from [facility]   Social Network    Social Network: Not on file  Intimate Partner Violence: Unknown (05/14/2021)   Received from [facility]   HITS    Physically Hurt: Not on file    Insult or Talk Down To: Not on file    Threaten Physical Harm: Not on file    Scream or Curse: Not on file    Family History  Problem Relation Age of Onset   Migraines Mother    Heart Problems Maternal Grandmother    Migraines Cousin     Past Medical History:  Diagnosis Date   Migraine     Patient Active Problem List   Diagnosis Date Noted   Migraine without aura and without status migrainosus, not intractable 05/04/2022   Depression 05/04/2022   Anxiety 05/04/2022   Other fatigue 05/04/2022    Past Surgical History:  Procedure Laterality Date   NO PAST SURGERIES      Current Outpatient Medications  Medication Sig Dispense Refill    Atogepant (QULIPTA) 60 MG TABS Take 1 tablet (60 mg total) by mouth daily. 30 tablet 11   Fremanezumab -vfrm (AJOVY ) 225 MG/1.5ML SOAJ Inject 225 mg into the skin every 30 (thirty) days. BIN#: [id number] PCN#: [id number] GROUP#: [id number] ID#: [id number] EXPIRES: 02/09/2023 1.5 mL 11   MAGNESIUM PO Take by mouth.     RIBOFLAVIN PO Take by mouth.     zonisamide (ZONEGRAN) 50 MG capsule Take 1 capsule (50 mg total) by mouth daily. 30 capsule 11   Ubrogepant  (UBRELVY ) 100 MG TABS Take 1 tablet (100 mg total) by mouth every 2 (two) hours as needed. Maximum 200mg  a day. 16 tablet 11   Zavegepant HCl (ZAVZPRET ) 10 MG/ACT SOLN Place 1 spray into the nose daily as needed.     zonisamide (ZONEGRAN) 100 MG capsule Take 1 capsule (100 mg total) by mouth daily. 30 capsule 11   No current facility-administered medications for this visit.    Allergies as of 07/22/2023 - Review Complete 07/22/2023  Allergen Reaction Noted   Topiramate Other (See Comments) 01/19/2019    Vitals: BP 114/75   Pulse 77   Ht 5' 5 (1.651 m)   Wt 192 lb (87.1 kg)   BMI 31.95 kg/m  Last Weight:  Wt Readings from Last 1 Encounters:  07/22/23 192 lb (87.1 kg)   Last Height:   Ht Readings from Last 1 Encounters:  07/22/23 5' 5 (1.651 m)    Physical exam: Exam: Gen: NAD, conversant      CV: No palpitations or chest pain or SOB. VS: Breathing at a normal rate. Weight appears obese. Not febrile. Eyes: Conjunctivae clear without exudates or hemorrhage  Neuro: Detailed Neurologic Exam  Speech:    Speech is normal; fluent and spontaneous with normal comprehension.  Cognition:    The patient is oriented to person, place, and time;     recent and remote memory intact;     language fluent;  normal attention, concentration, fund of knowledge Cranial Nerves:    The pupils are equal, round, and reactive to light. Visual fields are full Extraocular movements are intact.  The face is symmetric with normal sensation. The palate  elevates in the midline. Hearing intact. Voice is normal. Shoulder shrug is normal. The tongue has normal motion without fasciculations.   Coordination: normal  Gait:    No abnormalities noted or reported  Motor Observation:   no involuntary movements noted. Tone:    Appears normal  Posture:    Posture is normal. normal erect    Strength:    Strength is anti-gravity and symmetric in the upper and lower limbs.      Sensation: intact to LT, no reports of numbness or tingling or paresthesias         Assessment/Plan:  1. Migraine without aura and without status migrainosus.  She has 6-7 migraine days a month and , <10 total headache days a month. She lost her insurance. Calling the [facility] patient access support and filling out paperwork with her today.   Lost her insurance. Applying for the [employer] Patient Assistance for ubrelvy  and qulipta  Migraine Prevention: Start Zonisamide 50mg  at bedtime. In 2-4 weeks can increase to 100mg  at bedtime.  Migraine Prevention: Qulipta daily. Stop Ajovy . Can Try to get Qulipta through the [facility] Acute Management: Zavzpret  samples. Can order Ubrelvy  through the foundation.  Discussed: There is increased risk for stroke in women with migraine with aura and a contraindication for the combined contraceptive pill for use by women who have migraine with aura. The risk for women with migraine without aura is lower. However other risk factors like smoking are far more likely to increase stroke risk than migraine. There is a recommendation for no smoking and for the use of OCPs without estrogen such as progestogen only pills particularly for women with migraine with aura.Aaron Aas People who have migraine headaches with auras may be 3 times more likely to have a stroke caused by a blood clot, compared to migraine patients who don't see auras. Women who take hormone-replacement therapy may be 30 percent more likely to suffer a clot-based stroke than women not taking  medication containing estrogen. Other risk factors like smoking and high blood pressure may be  much more important.  1 year f/u   Migraine Prevention: Start Zonisamide 50mg  at bedtime. In 2-4 weeks can increase to 100mg  at bedtime.  Migraine Prevention: Qulipta daily. Stop Ajovy . Can Try to get it through the [facility] Acute Management: Gaev Zavzret samples. Can order Ubrelvy  through the foundation.  Meds ordered this encounter  Medications   zonisamide (ZONEGRAN) 50 MG capsule    Sig: Take 1 capsule (50 mg total) by mouth daily.    Dispense:  30 capsule    Refill:  11   DISCONTD: zonisamide (ZONEGRAN) 100 MG capsule    Sig: Take 1 capsule (100 mg total) by mouth daily.    Dispense:  30 capsule    Refill:  11   DISCONTD: Atogepant (QULIPTA) 60 MG TABS    Sig: Take 1 tablet (60 mg total) by mouth daily.    [id number] 10/2025   Zavegepant HCl (ZAVZPRET ) 10 MG/ACT SOLN    Sig: Place 1 spray into the nose daily as needed.    [id number] 08/2023   Atogepant (QULIPTA) 60 MG TABS    Sig: Take 1 tablet (60 mg total) by mouth daily.    Dispense:  30 tablet    Refill:  11   DISCONTD: Ubrogepant  (UBRELVY ) 100 MG TABS    Sig: Take 1 tablet (100 mg total) by mouth every 2 (two) hours as needed. Maximum 200mg  a day.    Dispense:  16 tablet    Refill:  11   zonisamide (ZONEGRAN) 100 MG capsule    Sig: Take 1 capsule (100 mg total) by mouth daily.    Dispense:  30 capsule    Refill:  11   Ubrogepant  (UBRELVY ) 100 MG TABS    Sig: Take 1 tablet (100 mg total) by mouth every 2 (two) hours as needed. Maximum 200mg  a day.    Dispense:  16 tablet    Refill:  11    Cc: Victorio Grave, MD,  Victorio Grave, MD  Aldona Amel, MD  [facility] [address] [place], [place] [address]  Phone [phone] Fax [phone]  I spent 16 minutes of face-to-face and non-face-to-face time with patient on the  1. Migraine without aura and without status migrainosus, not  intractable    diagnosis.  This included previsit chart review, lab review, study review, order entry, electronic health record documentation, patient education on the different diagnostic and therapeutic options, counseling and coordination of care, risks and benefits of management, compliance, or risk factor reduction

## 2023-07-22 NOTE — Patient Instructions (Addendum)
 Migraine Prevention: Start Zonisamide 50mg  at bedtime. In 2-4 weeks can increase to 100mg  at bedtime.  Migraine Prevention: Qulipta daily. Stop Ajovy . Can Try to get it through the foundation Acute Management: Zavzpret  samples. Can order Ubrelvy  through the foundation.  Zavegepant Nasal Spray What is this medication? ZAVEGEPANT (za VE je pant) treats migraines. It works by blocking a substance in the body that causes migraines. It is not used to prevent migraines. This medicine may be used for other purposes; ask your health care provider or pharmacist if you have questions. COMMON BRAND NAME(S): ZAVZPRET  What should I tell my care team before I take this medication? They need to know if you have any of these conditions: Kidney disease Liver disease An unusual or allergic reaction to zavegepant, other medications, foods, dyes, or preservatives Pregnant or trying to get pregnant Breast-feeding How should I use this medication? This medication is for use in the nose. Take it as directed on the prescription label. Do not use it more often than directed. Make sure that you are using your nasal spray correctly. Ask your care team if you have any questions. Talk to your care team about the use of this medication in children. Special care may be needed. Overdosage: If you think you have taken too much of this medicine contact a poison control center or emergency room at once. NOTE: This medicine is only for you. Do not share this medicine with others. What if I miss a dose? This does not apply. This medication is not for regular use. It should only be used as needed. What may interact with this medication? Decongestant nasal sprays This medication may affect how other medications work, and other medications may affect the way this medication works. Talk with your care team about all of the medications you take. They may suggest changes to your treatment plan to lower the risk of side effects and to  make sure your medications work as intended. This list may not describe all possible interactions. Give your health care provider a list of all the medicines, herbs, non-prescription drugs, or dietary supplements you use. Also tell them if you smoke, drink alcohol, or use illegal drugs. Some items may interact with your medicine. What should I watch for while using this medication? Visit your care team for regular checks on your progress. Tell your care team if your symptoms do not start to get better or if they get worse. What side effects may I notice from receiving this medication? Side effects that you should report to your care team as soon as possible: Allergic reactions--skin rash, itching, hives, swelling of the face, lips, tongue, or throat Side effects that usually do not require medical attention (report to your care team if they continue or are bothersome): Change in taste Dryness or irritation inside the nose Nausea Vomiting This list may not describe all possible side effects. Call your doctor for medical advice about side effects. You may report side effects to FDA at 1-800-FDA-1088. Where should I keep my medication? Keep out of the reach of children and pets. Store at room temperature between 20 and 25 degrees C (68 and 77 degrees F). Do not freeze. Get rid of any unused medication after the expiration date. To get rid of medications that are no longer needed or have expired: Take the medication to a medication take-back program. Check with your pharmacy or law enforcement to find a location. If you cannot return the medication, ask your pharmacist or care  team how to get rid of this medication safely. NOTE: This sheet is a summary. It may not cover all possible information. If you have questions about this medicine, talk to your doctor, pharmacist, or health care provider.  2024 Elsevier/Gold Standard (2021-04-24 00:00:00)Zonisamide Capsules What is this  medication? ZONISAMIDE (zoe NIS a mide) prevents and controls seizures in people with epilepsy. It works by calming overactive nerves in your body. This medicine may be used for other purposes; ask your health care provider or pharmacist if you have questions. COMMON BRAND NAME(S): Zonegran What should I tell my care team before I take this medication? They need to know if you have any of these conditions: Kidney disease Liver disease Low levels of bicarbonate in your blood Lung disease Suicidal thoughts, plans, or attempt An unusual or allergic reaction to zonisamide, sulfa medications, other medications, foods, dyes, or preservatives Pregnant or trying to get pregnant Breast-feeding How should I use this medication? Take this medication by mouth with water. Take it as directed on the prescription label at the same time every day. Do not cut, crush, or chew this medication. Swallow the capsules whole. You can take it with or without food. If it upsets your stomach, take it with food. Keep taking it unless your care team tells you to stop. A special MedGuide will be given to you by the pharmacist with each prescription and refill. Be sure to read this information carefully each time. Talk to your care team about the use of this medication in children. While it may be prescribed for children as young as 16 years for selected conditions, precautions do apply. Overdosage: If you think you have taken too much of this medicine contact a poison control center or emergency room at once. NOTE: This medicine is only for you. Do not share this medicine with others. What if I miss a dose? If you miss a dose, take it as soon as you can. If it is almost time for your next dose, take only that dose. Do not take double or extra doses. What may interact with this medication? Acetazolamide Alcohol Antihistamines for allergy, cough, and cold Certain medications for anxiety or sleep Certain medications for  depression, such as amitriptyline, fluoxetine, sertraline Certain medications for seizures, such as carbamazepine, phenobarbital, phenytoin, primidone, topiramate Dichlorphenamide General anesthetics, such as halothane, isoflurane, methoxyflurane, propofol Medications that relax muscles for surgery Opioid medications for pain or cough Phenothiazines, such as chlorpromazine, mesoridazine, prochlorperazine , thioridazine Rifampin This list may not describe all possible interactions. Give your health care provider a list of all the medicines, herbs, non-prescription drugs, or dietary supplements you use. Also tell them if you smoke, drink alcohol, or use illegal drugs. Some items may interact with your medicine. What should I watch for while using this medication? Visit your care team for regular checks on your progress. Tell your care team if your symptoms do not start to get better or if they get worse. Do not suddenly stop taking this medication. You may develop a severe reaction. Your care team will tell you how much medication to take. If your care team wants you to stop the medication, the dose may be slowly lowered over time to avoid any side effects. This medication may affect your coordination, reaction time, or judgment. Do not drive or operate machinery until you know how this medication affects you. Sit up or stand slowly to reduce the risk of dizzy or fainting spells. Drinking alcohol with this medication can increase  the risk of these side effects. This medication may cause serious skin reactions. They can happen weeks to months after starting the medication. Contact your care team right away if you notice fevers or flu-like symptoms with a rash. The rash may be red or purple and then turn into blisters or peeling of the skin. You may also notice a red rash with swelling of the face, lips, or lymph nodes in your neck or under your arms. This medication may cause thoughts of suicide or  depression. This includes sudden changes in mood, behaviors, or thoughts. These changes can happen at any time but are more common in the beginning of treatment or after a change in dose. Call your care team right away if you experience these thoughts or worsening depression. Tell your care team right away if you have any change in your eyesight. Wear a medical ID bracelet or chain. Carry a card that describes your condition. List the medications and doses you take on the card. Talk to your care team if you may be pregnant. Serious birth defects can occur if you take this medication during pregnancy. What side effects may I notice from receiving this medication? Side effects that you should report to your care team as soon as possible: Allergic reactions--skin rash, itching, hives, swelling of the face, lips, tongue, or throat Aplastic anemia--unusual weakness or fatigue, dizziness, headache, trouble breathing, increased bleeding or bruising CNS depression--slow or shallow breathing, shortness of breath, feeling faint, dizziness, confusion, trouble staying awake Fever that does not go away, decreased sweating High acid level--trouble breathing, unusual weakness or fatigue, confusion, headache, fast or irregular heartbeat, nausea, vomiting High ammonia level--unusual weakness or fatigue, confusion, loss of appetite, nausea, vomiting, seizures Infection--fever, chills, cough, or sore throat Kidney stones--blood in urine, pain or trouble passing urine, pain in the lower back or sides Rash, fever, and swollen lymph nodes Redness, blistering, peeling, or loosening of the skin, including inside the mouth Sudden eye pain or change in vision such as blurry vision, seeing halos around lights, vision loss Thoughts of suicide or self harm, worsening mood, feelings of depression Side effects that usually do not require medical attention (report to your care team if they continue or are bothersome): Difficulty  with paying attention, memory, or speech Dizziness Drowsiness Irritability Loss of appetite Loss of balance or coordination Slow or sluggish movements of the body This list may not describe all possible side effects. Call your doctor for medical advice about side effects. You may report side effects to FDA at 1-800-FDA-1088. Where should I keep my medication? Keep out of the reach of children and pets. Store at room temperature between 20 and 25 degrees C (68 and 77 degrees F). Protect from light. Get rid of any unused medication after the expiration date. To get rid of medications that are no longer needed or have expired: Take the medication to a medication take-back program. Check with your pharmacy or law enforcement to find a location. If you cannot return the medication, check the label or package insert to see if the medication should be thrown out in the garbage or flushed down the toilet. If you are not sure, ask your care team. If it is safe to put it in the trash, pour the medication out of the container. Mix the medication with cat litter, dirt, coffee grounds, or other unwanted substance. Seal the mixture in a bag or container. Put it in the trash. NOTE: This sheet is a summary. It  may not cover all possible information. If you have questions about this medicine, talk to your doctor, pharmacist, or health care provider.  2024 Elsevier/Gold Standard (2022-06-03 00:00:00)Atogepant Tablets What is this medication? ATOGEPANT (a TOE je pant) prevents migraines. It works by blocking a substance in the body that causes migraines. This medicine may be used for other purposes; ask your health care provider or pharmacist if you have questions. COMMON BRAND NAME(S): QULIPTA What should I tell my care team before I take this medication? They need to know if you have any of these conditions: Kidney disease Liver disease An unusual or allergic reaction to atogepant, other medications, foods,  dyes, or preservatives Pregnant or trying to get pregnant Breast-feeding How should I use this medication? Take this medication by mouth with water. Take it as directed on the prescription label at the same time every day. You can take it with or without food. If it upsets your stomach, take it with food. Keep taking it unless your care team tells you to stop. Talk to your care team about the use of this medication in children. Special care may be needed. Overdosage: If you think you have taken too much of this medicine contact a poison control center or emergency room at once. NOTE: This medicine is only for you. Do not share this medicine with others. What if I miss a dose? If you miss a dose, take it as soon as you can. If it is almost time for your next dose, take only that dose. Do not take double or extra doses. What may interact with this medication? Carbamazepine Certain medications for fungal infections, such as itraconazole, ketoconazole Clarithromycin Cyclosporine Efavirenz Etravirine Phenytoin Rifampin St. John's wort This list may not describe all possible interactions. Give your health care provider a list of all the medicines, herbs, non-prescription drugs, or dietary supplements you use. Also tell them if you smoke, drink alcohol, or use illegal drugs. Some items may interact with your medicine. What should I watch for while using this medication? Visit your care team for regular checks on your progress. Tell your care team if your symptoms do not start to get better or if they get worse. What side effects may I notice from receiving this medication? Side effects that you should report to your care team as soon as possible: Allergic reactions--skin rash, itching, hives, swelling of the face, lips, tongue, or throat Side effects that usually do not require medical attention (report to your care team if they continue or are bothersome): Constipation Fatigue Loss of appetite  with weight loss Nausea This list may not describe all possible side effects. Call your doctor for medical advice about side effects. You may report side effects to FDA at 1-800-FDA-1088. Where should I keep my medication? Keep out of the reach of children and pets. Store at room temperature between 20 and 25 degrees C (68 and 77 degrees F). Get rid of any unused medication after the expiration date. To get rid of medications that are no longer needed or have expired: Take the medication to a medication take-back program. Check with your pharmacy or law enforcement to find a location. If you cannot return the medication, check the label or package insert to see if the medication should be thrown out in the garbage or flushed down the toilet. If you are not sure, ask your care team. If it is safe to put it in the trash, take the medication out of the container. Mix  the medication with cat litter, dirt, coffee grounds, or other unwanted substance. Seal the mixture in a bag or container. Put it in the trash. NOTE: This sheet is a summary. It may not cover all possible information. If you have questions about this medicine, talk to your doctor, pharmacist, or health care provider.  2024 Elsevier/Gold Standard (2021-03-17 00:00:00)

## 2023-10-21 IMAGING — CT CT RENAL STONE PROTOCOL
2 of 4 series · 16 of 46 positions shown, 18 images · non-contrast
Comparison: None.

CLINICAL DATA: 25-year-old female with flank and low back pain.

EXAM:
CT ABDOMEN AND PELVIS WITHOUT CONTRAST
TECHNIQUE: Multidetector CT imaging of the abdomen and pelvis was performed
following the standard protocol without IV contrast.

[Series 2: ap without · axial · non-contrast · 0.79mm/px · z∈[+703,+1088]mm · 13 of 87 slices shown, 15 images]
[im 5/87  soft-tissue]
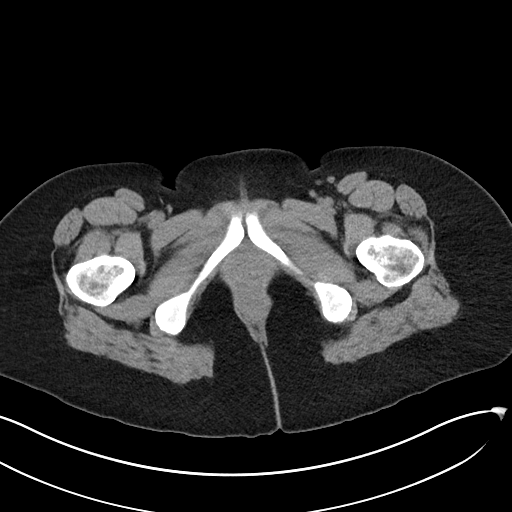
[im 5/87  bone]
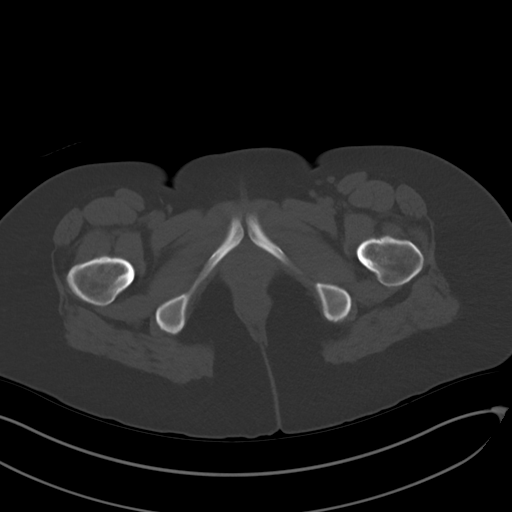
[im 14/87  soft-tissue]
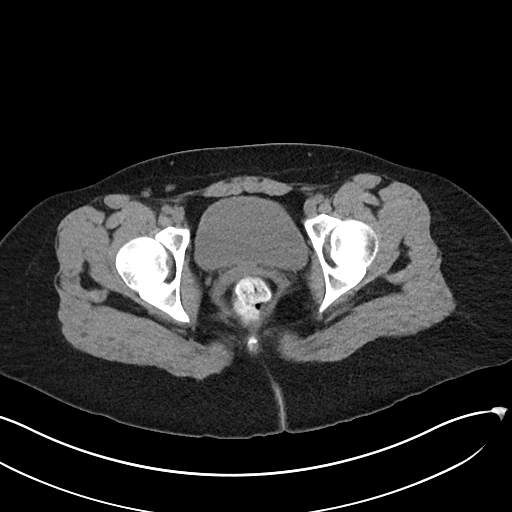
[im 19/87  soft-tissue]
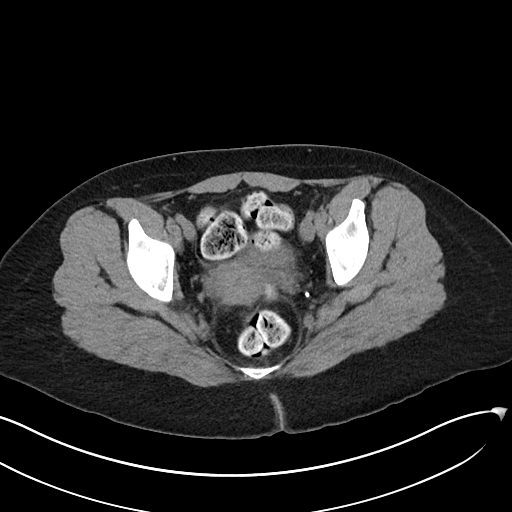
[im 23/87  soft-tissue]
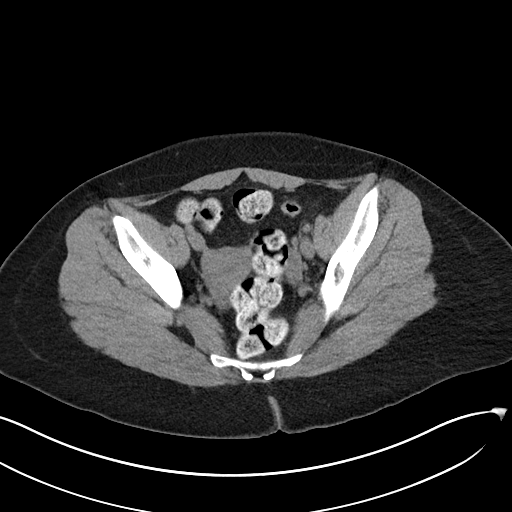
[im 32/87  soft-tissue]
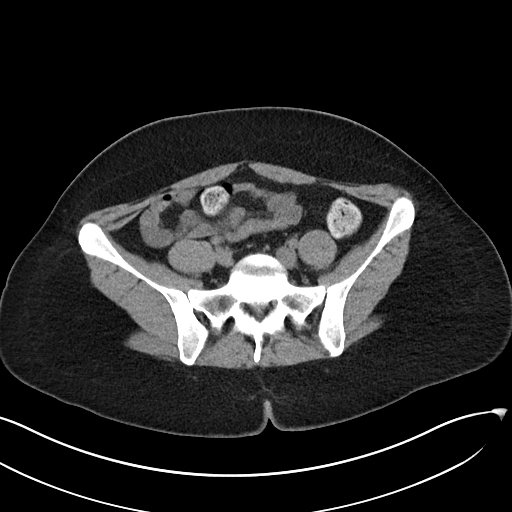
[im 37/87  soft-tissue]
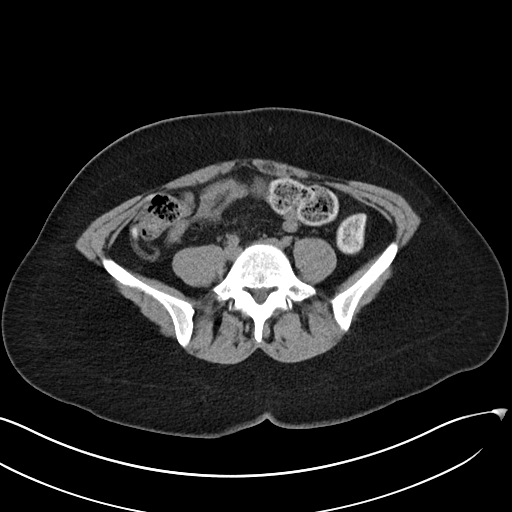
[im 46/87  soft-tissue]
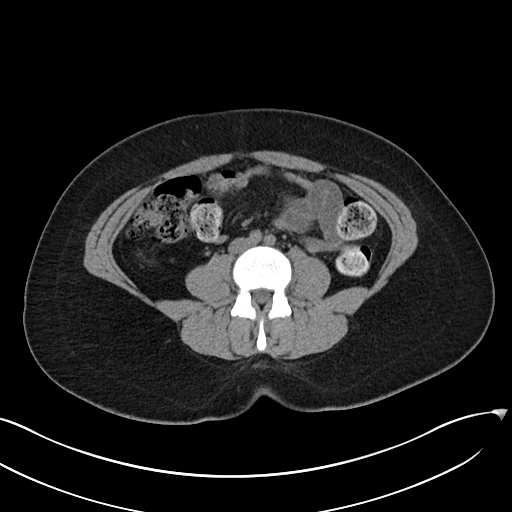
[im 50/87  soft-tissue]
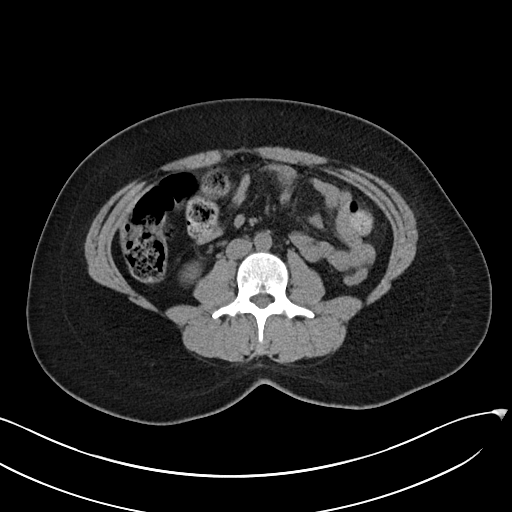
[im 55/87  soft-tissue]
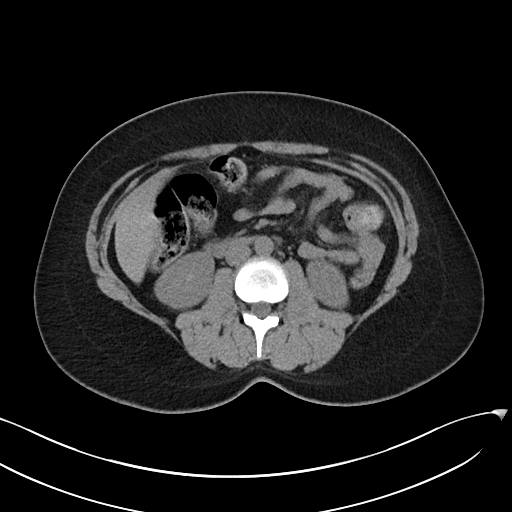
[im 55/87  bone]
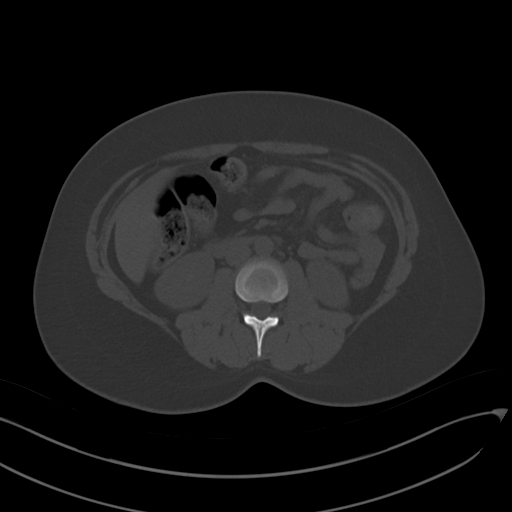
[im 64/87  soft-tissue]
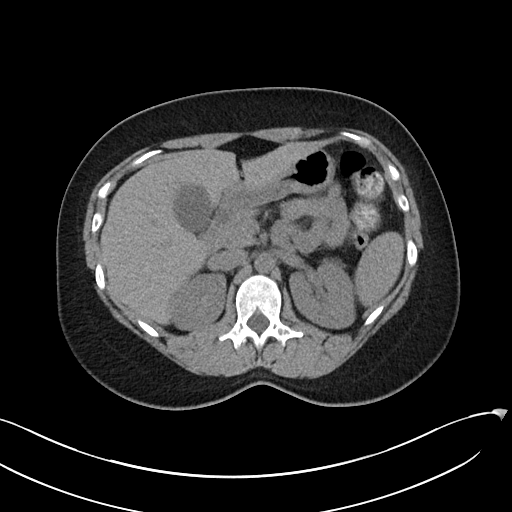
[im 68/87  soft-tissue]
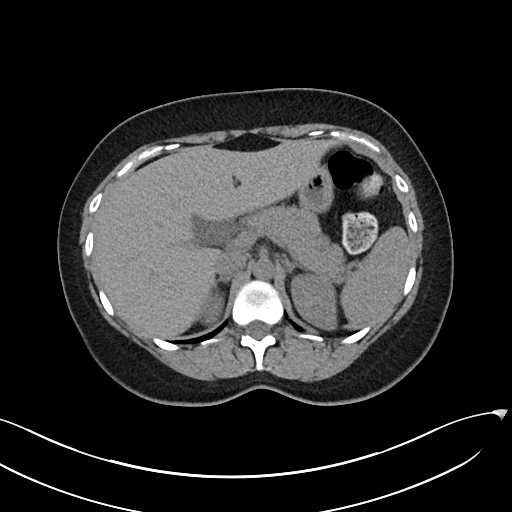
[im 73/87  soft-tissue]
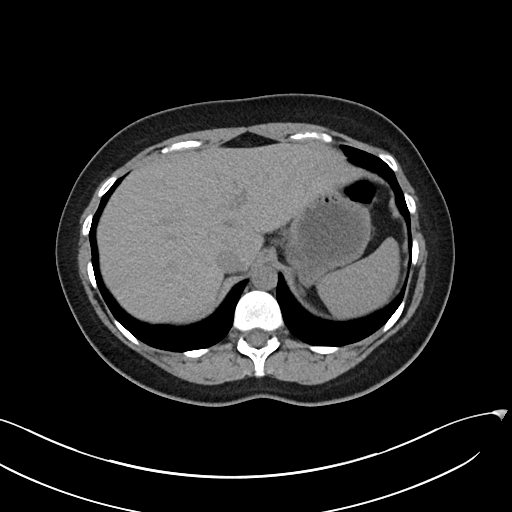
[im 82/87  soft-tissue]
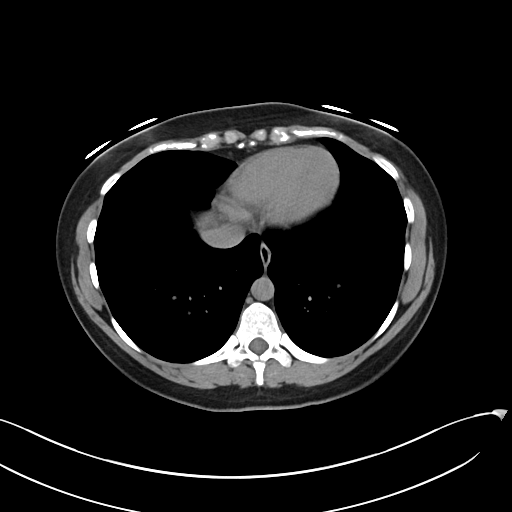

[Series 5: cor · coronal · 0.68mm/px · 3 of 94 slices shown]
[im 32/94  soft-tissue]
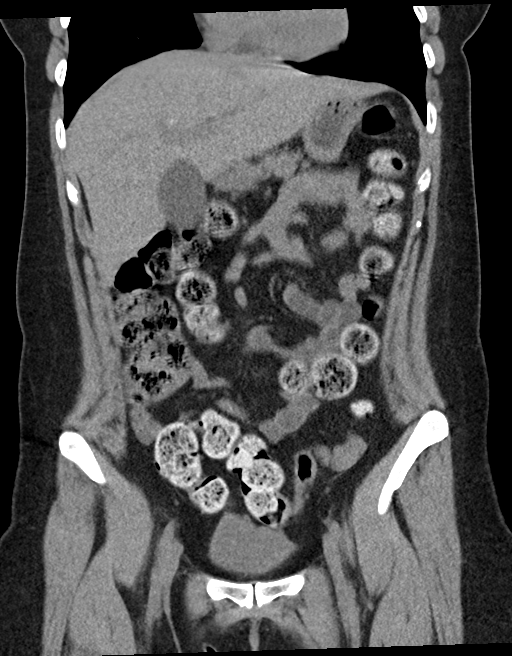
[im 42/94  soft-tissue]
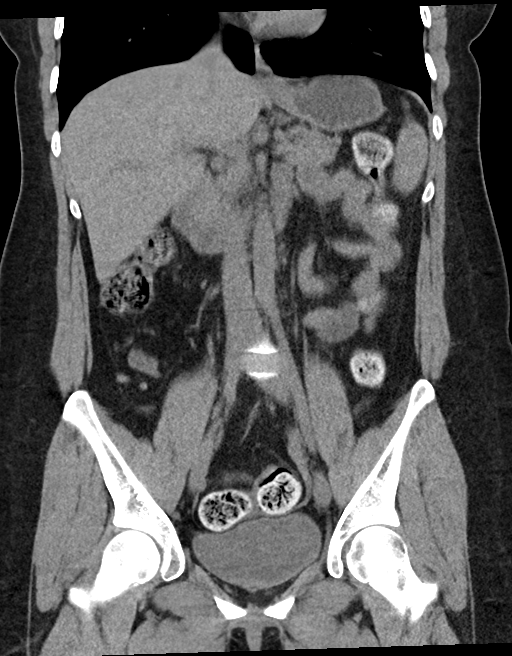
[im 52/94  soft-tissue]
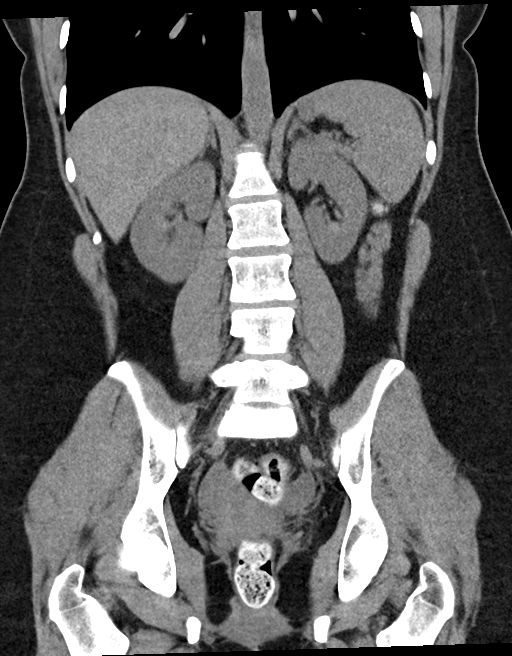

[16 of 46 positions shown; findings below may reference images not displayed]

FINDINGS: Lower chest: Negative; minimal left costophrenic angle atelectasis
suspected.

Hepatobiliary: Negative noncontrast liver and gallbladder.

Pancreas: Negative.

Spleen: Negative.

Adrenals/Urinary Tract: Normal adrenal glands.

Noncontrast kidneys appears symmetric and normal. No hydronephrosis.
No convincing nephrolithiasis. No pararenal inflammation. Proximal
ureters are decompressed. Distal ureters are sometimes difficult to
delineate, and there are 2 bilateral pelvic phleboliths, but there
is no evidence of a distal ureteral calculus. Unremarkable bladder.

Stomach/Bowel: Somewhat redundant large bowel with hyperdense
retained stool throughout much of the colon. Normal retrocecal
appendix on series 2, image 51. No large bowel inflammation.
Decompressed terminal ileum. No dilated small bowel. Stomach and
duodenum appear negative. No free air, free fluid, mesenteric
inflammation.

Vascular/Lymphatic: Normal caliber abdominal aorta. No calcified
atherosclerosis or lymphadenopathy identified.

Reproductive: Negative noncontrast appearance.

Other: No pelvic free fluid.

Musculoskeletal: Negative.
IMPRESSION: No urinary calculus or obstructive uropathy. Normal appendix.
Negative non-contrast abdomen and pelvis.

## 2023-11-18 ENCOUNTER — Telehealth: Payer: Self-pay

## 2023-11-18 NOTE — Telephone Encounter (Signed)
 Called and left voicemail for patient to reschedule appointment on 07/24/24 with Dr Ines.  If patient calls back, they can be rescheduled with Dr Margaret

## 2024-07-24 ENCOUNTER — Ambulatory Visit: Admitting: Neurology
